# Patient Record
Sex: Male | Born: 1955 | Race: Black or African American | Hispanic: No | Marital: Single | Smoking: Current every day smoker
Health system: Southern US, Community
[De-identification: ages and names within clinical notes are randomized; demographics above are authoritative.]

## PROBLEM LIST (undated history)

## (undated) DIAGNOSIS — Z960 Presence of urogenital implants: Secondary | ICD-10-CM

## (undated) DIAGNOSIS — R399 Unspecified symptoms and signs involving the genitourinary system: Secondary | ICD-10-CM

## (undated) DIAGNOSIS — G629 Polyneuropathy, unspecified: Secondary | ICD-10-CM

## (undated) DIAGNOSIS — R269 Unspecified abnormalities of gait and mobility: Secondary | ICD-10-CM

## (undated) DIAGNOSIS — N2889 Other specified disorders of kidney and ureter: Secondary | ICD-10-CM

## (undated) DIAGNOSIS — T8859XA Other complications of anesthesia, initial encounter: Secondary | ICD-10-CM

## (undated) DIAGNOSIS — E785 Hyperlipidemia, unspecified: Secondary | ICD-10-CM

## (undated) DIAGNOSIS — I6523 Occlusion and stenosis of bilateral carotid arteries: Secondary | ICD-10-CM

## (undated) DIAGNOSIS — N201 Calculus of ureter: Secondary | ICD-10-CM

## (undated) DIAGNOSIS — Z8546 Personal history of malignant neoplasm of prostate: Secondary | ICD-10-CM

## (undated) DIAGNOSIS — I739 Peripheral vascular disease, unspecified: Secondary | ICD-10-CM

## (undated) DIAGNOSIS — Z973 Presence of spectacles and contact lenses: Secondary | ICD-10-CM

## (undated) DIAGNOSIS — K219 Gastro-esophageal reflux disease without esophagitis: Secondary | ICD-10-CM

## (undated) DIAGNOSIS — I10 Essential (primary) hypertension: Secondary | ICD-10-CM

## (undated) DIAGNOSIS — K449 Diaphragmatic hernia without obstruction or gangrene: Secondary | ICD-10-CM

## (undated) DIAGNOSIS — T4145XA Adverse effect of unspecified anesthetic, initial encounter: Secondary | ICD-10-CM

## (undated) HISTORY — PX: ROBOT ASSISTED LAPAROSCOPIC RADICAL PROSTATECTOMY: SHX5141

## (undated) HISTORY — DX: Occlusion and stenosis of bilateral carotid arteries: I65.23

## (undated) HISTORY — DX: Hyperlipidemia, unspecified: E78.5

---

## 1995-10-14 ENCOUNTER — Encounter: Payer: Self-pay | Admitting: Cardiology

## 2007-04-01 DIAGNOSIS — K921 Melena: Secondary | ICD-10-CM | POA: Insufficient documentation

## 2007-04-02 DIAGNOSIS — D509 Iron deficiency anemia, unspecified: Secondary | ICD-10-CM | POA: Insufficient documentation

## 2007-04-08 ENCOUNTER — Ambulatory Visit: Payer: Self-pay | Admitting: Gastroenterology

## 2007-04-25 ENCOUNTER — Ambulatory Visit: Payer: Self-pay | Admitting: Gastroenterology

## 2007-04-25 HISTORY — PX: COLONOSCOPY: SHX174

## 2007-08-07 DIAGNOSIS — R195 Other fecal abnormalities: Secondary | ICD-10-CM | POA: Insufficient documentation

## 2008-04-11 LAB — HM MAMMOGRAPHY

## 2009-07-15 ENCOUNTER — Encounter: Payer: Self-pay | Admitting: Cardiology

## 2009-07-15 ENCOUNTER — Inpatient Hospital Stay (HOSPITAL_COMMUNITY): Admission: EM | Admit: 2009-07-15 | Discharge: 2009-07-16 | Payer: Self-pay | Admitting: Emergency Medicine

## 2009-07-15 ENCOUNTER — Encounter (INDEPENDENT_AMBULATORY_CARE_PROVIDER_SITE_OTHER): Payer: Self-pay | Admitting: *Deleted

## 2009-07-21 ENCOUNTER — Encounter (INDEPENDENT_AMBULATORY_CARE_PROVIDER_SITE_OTHER): Payer: Self-pay | Admitting: Internal Medicine

## 2009-07-21 ENCOUNTER — Ambulatory Visit (HOSPITAL_COMMUNITY): Admission: RE | Admit: 2009-07-21 | Discharge: 2009-07-21 | Payer: Self-pay | Admitting: Internal Medicine

## 2009-10-07 ENCOUNTER — Ambulatory Visit: Payer: Self-pay | Admitting: Vascular Surgery

## 2010-07-11 NOTE — Consult Note (Signed)
Summary: [facility] CONSULT NOTE  [facility] CONSULT NOTE   Imported By: Zachary George 07/18/2009 14:18:30  _____________________________________________________________________  External Attachment:    Type:   Image     Comment:   External Document

## 2010-07-13 ENCOUNTER — Ambulatory Visit: Payer: Managed Care, Other (non HMO) | Attending: Urology | Admitting: Physical Therapy

## 2010-07-13 ENCOUNTER — Encounter: Admit: 2010-07-13 | Payer: Self-pay | Admitting: Urology

## 2010-07-13 DIAGNOSIS — M2420 Disorder of ligament, unspecified site: Secondary | ICD-10-CM | POA: Insufficient documentation

## 2010-07-13 DIAGNOSIS — M629 Disorder of muscle, unspecified: Secondary | ICD-10-CM | POA: Insufficient documentation

## 2010-07-13 DIAGNOSIS — IMO0001 Reserved for inherently not codable concepts without codable children: Secondary | ICD-10-CM | POA: Insufficient documentation

## 2010-07-28 ENCOUNTER — Other Ambulatory Visit: Payer: Self-pay | Admitting: Urology

## 2010-07-28 ENCOUNTER — Ambulatory Visit (HOSPITAL_COMMUNITY)
Admission: RE | Admit: 2010-07-28 | Discharge: 2010-07-28 | Disposition: A | Discharge status: Home / Self Care | Payer: Managed Care, Other (non HMO) | Source: Ambulatory Visit | Attending: Urology | Admitting: Urology

## 2010-07-28 ENCOUNTER — Encounter (HOSPITAL_COMMUNITY): Payer: Managed Care, Other (non HMO)

## 2010-07-28 DIAGNOSIS — Z01818 Encounter for other preprocedural examination: Secondary | ICD-10-CM

## 2010-07-28 DIAGNOSIS — N429 Disorder of prostate, unspecified: Secondary | ICD-10-CM | POA: Insufficient documentation

## 2010-07-28 LAB — BASIC METABOLIC PANEL
BUN: 10 mg/dL (ref 6–23)
CO2: 24 mEq/L (ref 19–32)
GFR calc non Af Amer: >60 mL/min (ref >60)
Glucose, Bld: 90 mg/dL (ref 70–99)
Potassium: 4.1 mEq/L (ref 3.5–5.1)

## 2010-07-28 LAB — CBC
HCT: 39.6 % (ref 39.0–52.0)
Hemoglobin: 13.7 g/dL (ref 13.0–17.0)
MCHC: 34.6 g/dL (ref 30.0–36.0)
MCV: 78.3 fL (ref 78.0–100.0)
Platelets: 215 10*3/uL (ref 150–400)
RDW: 13.2 % (ref 11.5–15.5)
WBC: 6.7 10*3/uL (ref 4.0–10.5)

## 2010-07-28 LAB — SURGICAL PCR SCREEN: MRSA, PCR: NEGATIVE

## 2010-08-07 ENCOUNTER — Inpatient Hospital Stay (HOSPITAL_COMMUNITY)
Admission: RE | Admit: 2010-08-07 | Discharge: 2010-08-09 | DRG: 708 | Disposition: A | Discharge status: Home / Self Care | Payer: Managed Care, Other (non HMO) | Source: Ambulatory Visit | Attending: Urology | Admitting: Urology

## 2010-08-07 ENCOUNTER — Other Ambulatory Visit: Payer: Self-pay | Admitting: Urology

## 2010-08-07 ENCOUNTER — Inpatient Hospital Stay (HOSPITAL_COMMUNITY): Admit: 2010-08-07 | Payer: Self-pay | Admitting: Urology

## 2010-08-07 DIAGNOSIS — R110 Nausea: Secondary | ICD-10-CM | POA: Diagnosis not present

## 2010-08-07 DIAGNOSIS — C61 Malignant neoplasm of prostate: Principal | ICD-10-CM | POA: Diagnosis present

## 2010-08-07 DIAGNOSIS — F17200 Nicotine dependence, unspecified, uncomplicated: Secondary | ICD-10-CM | POA: Diagnosis present

## 2010-08-07 LAB — ABO/RH: ABO/RH(D): B POS

## 2010-08-07 LAB — HEMOGLOBIN AND HEMATOCRIT, BLOOD
HCT: 35.7 % — ABNORMAL LOW (ref 39.0–52.0)
Hemoglobin: 12.4 g/dL — ABNORMAL LOW (ref 13.0–17.0)

## 2010-08-07 LAB — TYPE AND SCREEN: Antibody Screen: NEGATIVE

## 2010-08-08 LAB — HEMOGLOBIN AND HEMATOCRIT, BLOOD: Hemoglobin: 11.9 g/dL — ABNORMAL LOW (ref 13.0–17.0)

## 2010-08-12 NOTE — Op Note (Signed)
Perry [name], Perry [name]                ACCOUNT NO.:  [id number]  MEDICAL RECORD NO.:  [id number]           PATIENT TYPE:  I  LOCATION:  X001                         FACILITY:  [facility]  PHYSICIAN:  Heloise Purpura, MD      DATE OF BIRTH:  Mar 19, 1956  DATE OF PROCEDURE:  08/07/2010 DATE OF DISCHARGE:                              OPERATIVE REPORT   PREOPERATIVE DIAGNOSIS:  Clinically localized adenocarcinoma of the prostate (clinical stage T1C NX MX).  POSTOPERATIVE DIAGNOSIS:  Clinically localized adenocarcinoma of the prostate (clinical stage T1C NX MX).  PROCEDURE:  Robotic assisted laparoscopic radical prostatectomy (bilateral nerve sparing).  SURGEON:  Heloise Purpura, MD  ANESTHESIA:  General.  COMPLICATIONS:  None.  ESTIMATED BLOOD LOSS:  300 mL.  INTRAVENOUS FLUIDS:  1300 mL of lactated Ringer's.  SPECIMENS:  Prostate and seminal vesicles.  DISPOSITION OF SPECIMEN:  Pathology.  DRAINS: 1. 20-French Coude catheter. 2. #19 Blake pelvic drain.  INDICATIONS:  Mr. Warmuth is a 55 year old gentleman with clinically localized prostate cancer.  After discussion regarding management options for treatment, he elected to proceed with surgical therapy and the above procedure.  The potential risks, complications, and alternative treatment options were discussed in detail and informed consent was obtained.  DESCRIPTION OF PROCEDURE:  The patient was taken to the operating room and general anesthetic was administered.  He was given preoperative antibiotics, placed in the dorsal lithotomy position and prepped and draped in the usual sterile fashion.  Next, preoperative time-out was performed.  A Foley catheter was then inserted into the bladder and a site was selected just superior to the umbilicus for placement of the camera port.  This was placed using a standard open Hasson technique which allowed entry into the peritoneal cavity under direct vision and without difficulty.  A  12-mm port was then placed and pneumoperitoneum established.  With a 0-degree lens, the abdomen was inspected and there was no evidence of any intra-abdominal injuries or other abnormalities. The remaining ports were then placed.  The 8-mm robotic ports were placed in the right lower quadrant, left lower quadrant and far left lower quadrant.  A 5-mm port was placed in the right upper quadrant and a 12-mm port was placed in the far right lateral abdominal wall for laparoscopic assistance.  All ports were placed under direct vision and without difficulty.  The surgical cart was then docked.  With the aid of the cautery scissors, the bladder was reflected posteriorly allowing entry into space of Retzius and identification of the endopelvic fascia and prostate.  The endopelvic fascia was then incised from the apex back to the base of prostate bilaterally and the underlying levator muscle fibers were swept laterally off the prostate thereby isolating the dorsal venous complex.  The dorsal vein was then stapled and divided with a 45-mm Flex Echelon stapler.  The bladder neck was identified with the aid of Foley catheter manipulation and divided anteriorly.  This exposed the catheter and the catheter balloon was deflated.  The catheter was then brought into the operative field and used to retract the prostate anteriorly.  The posterior bladder neck  was then examined. There did appear to be a small median lobe which was enucleated and excised with the prostate.  Dissection proceeded between the prostate and bladder posteriorly until the vasa deferentia and seminal vesicles were identified.  The vasa deferentia were isolated, divided and lifted anteriorly.  The seminal vesicles were then dissected down to their tips with care to control the seminal vesicle arterial blood supply.  These structures were then also lifted anteriorly.  The space between Denonvilliers fascia was then dissected with a  combination of sharp and blunt dissection thereby isolating the vascular pedicles of the prostate.  The lateral prostatic fascia was then incised bilaterally allowing the neurovascular bundles to be released.  The vascular pedicles were then ligated with Hem-o-lok clips above the level of neurovascular bundles and divided with sharp cold scissor dissection allowing neurovascular preservation bilaterally.  The neurovascular bundles were then swept off the apex of the prostate and urethra and the urethra was sharply transected allowing the prostate specimen to be disarticulated.  The pelvis was then copiously irrigated.  There was no evidence for rectal injury.  There was noted be some significant venous bleeding from the distal right neurovascular bundle.  This was controlled with a figure-of-eight 3-0 Monocryl suture.  Attention then turned to the urethral anastomosis.  A 2-0 Vicryl slip-knot was placed between Denonvilliers fascia, the posterior bladder neck, and the posterior urethra to reapproximate these structures.  A double-armed 3-0 Monocryl suture was then used to perform a 360-degree running tension- free anastomosis between the bladder neck and urethra.  A new 20-French Coude catheter was then inserted into the bladder and irrigated.  There were no blood clots within the bladder and the anastomosis appeared to be watertight.  A #19 Blake drain was then brought through the left robotic port and positioned appropriately within the pelvis.  It was secured to the skin with a nylon suture.  The surgical cart was then undocked.  The right lateral 12-mm port site was closed with a 0 Vicryl suture placed laparoscopically.  All remaining ports were then removed under direct vision and the prostate specimen was removed intact within the Endopouch retrieval bag via the periumbilical port site.  This fascial opening was then closed with 2 running 0 Vicryl sutures.  All port sites were  injected with 0.25% Marcaine and reapproximated at the skin level with staples.  Sterile dressings were applied.  The patient appeared to tolerate the procedure well without complications.  He was able to be extubated and transferred to the recovery unit in satisfactory condition.     Heloise Purpura, MD     LB/MEDQ  D:  08/07/2010  T:  08/07/2010  Job:  [id number]  Electronically Signed by Heloise Purpura MD on 08/12/2010 08:04:06 AM

## 2010-08-31 LAB — BASIC METABOLIC PANEL
BUN: 10 mg/dL (ref 6–23)
Chloride: 111 mEq/L (ref 96–112)
Creatinine, Ser: 0.96 mg/dL (ref 0.4–1.5)
GFR calc Af Amer: >60 mL/min (ref >60)
Glucose, Bld: 115 mg/dL — ABNORMAL HIGH (ref 70–99)
Potassium: 3.8 mEq/L (ref 3.5–5.1)

## 2010-08-31 LAB — CARDIAC PANEL(CRET KIN+CKTOT+MB+TROPI)
CK, MB: 5.3 ng/mL — ABNORMAL HIGH (ref 0.3–4.0)
Relative Index: 1.4 (ref 0.0–2.5)
Total CK: 380 U/L — ABNORMAL HIGH (ref 7–232)
Troponin I: 0.02 ng/mL (ref 0.00–0.06)

## 2010-08-31 LAB — PROTIME-INR
INR: 1.08 (ref 0.00–1.49)
Prothrombin Time: 13.9 seconds (ref 11.6–15.2)

## 2010-08-31 LAB — POCT CARDIAC MARKERS

## 2010-08-31 LAB — URINALYSIS, ROUTINE W REFLEX MICROSCOPIC
Bilirubin Urine: NEGATIVE
pH: 7 (ref 5.0–8.0)

## 2010-08-31 LAB — DIFFERENTIAL
Basophils Absolute: 0.1 10*3/uL (ref 0.0–0.1)
Basophils Relative: 1 % (ref 0–1)
Eosinophils Absolute: 0.2 10*3/uL (ref 0.0–0.7)
Eosinophils Relative: 3 % (ref 0–5)
Lymphocytes Relative: 27 % (ref 12–46)
Lymphs Abs: 1.8 10*3/uL (ref 0.7–4.0)
Monocytes Absolute: 0.5 10*3/uL (ref 0.1–1.0)
Monocytes Relative: 7 % (ref 3–12)

## 2010-08-31 LAB — APTT: aPTT: 28 seconds (ref 24–37)

## 2010-08-31 LAB — CBC
Platelets: 211 10*3/uL (ref 150–400)
RDW: 14.1 % (ref 11.5–15.5)
WBC: 6.7 10*3/uL (ref 4.0–10.5)

## 2010-08-31 LAB — RAPID URINE DRUG SCREEN, HOSP PERFORMED
Amphetamines: NOT DETECTED
Benzodiazepines: NOT DETECTED
Cocaine: NOT DETECTED
Opiates: NOT DETECTED

## 2010-08-31 LAB — CK TOTAL AND CKMB (NOT AT ARMC): CK, MB: 5.8 ng/mL — ABNORMAL HIGH (ref 0.3–4.0)

## 2010-08-31 LAB — MAGNESIUM: Magnesium: 2 mg/dL (ref 1.5–2.5)

## 2010-09-12 ENCOUNTER — Ambulatory Visit: Payer: Managed Care, Other (non HMO) | Attending: Urology | Admitting: Physical Therapy

## 2010-09-12 DIAGNOSIS — IMO0001 Reserved for inherently not codable concepts without codable children: Secondary | ICD-10-CM | POA: Insufficient documentation

## 2010-09-12 DIAGNOSIS — M2420 Disorder of ligament, unspecified site: Secondary | ICD-10-CM | POA: Insufficient documentation

## 2010-09-12 DIAGNOSIS — M629 Disorder of muscle, unspecified: Secondary | ICD-10-CM | POA: Insufficient documentation

## 2010-09-26 ENCOUNTER — Ambulatory Visit: Payer: Managed Care, Other (non HMO) | Admitting: Physical Therapy

## 2010-10-10 ENCOUNTER — Ambulatory Visit: Payer: Managed Care, Other (non HMO) | Attending: Urology | Admitting: Physical Therapy

## 2010-10-10 DIAGNOSIS — M2420 Disorder of ligament, unspecified site: Secondary | ICD-10-CM | POA: Insufficient documentation

## 2010-10-10 DIAGNOSIS — IMO0001 Reserved for inherently not codable concepts without codable children: Secondary | ICD-10-CM | POA: Insufficient documentation

## 2010-10-10 DIAGNOSIS — M629 Disorder of muscle, unspecified: Secondary | ICD-10-CM | POA: Insufficient documentation

## 2010-10-24 NOTE — Assessment & Plan Note (Signed)
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 Rudi Heap for evaluation of  rectal bleeding and positive Hemoccult cards.   HISTORY OF PRESENT ILLNESS:  Mr. Meditz denies any general medical or GI  problems.  He had some asymptomatic rectal bleeding approximately a week  ago, saw Dr. Christell Constant, and Guaiac positive stools.  Hemoglobin was normal  at 14.1 but he had a low MCV of 79 suggestive of iron deficiency.  The  patient has regular bowel movements and denies any upper GI or  hepatobiliary complaints.  Has never had previous GI evaluations.  He  denies any chronic medical illnesses.   FAMILY HISTORY:  Noncontributory.   SOCIAL HISTORY:  He is separated and lives alone.  He has a 12th grade  education.  He smokes a pack of cigarettes per day and uses ethanol  socially.   REVIEW OF SYSTEMS:  Noncontributory.   MEDICATIONS:  None.   ALLERGIES:  None.   PHYSICAL EXAMINATION:  GENERAL:  He is a healthy appearing middle-aged  black male in no distress appearing his stated age.  He is 5 feet, 9 and  weighs 217 pounds.  VITAL SIGNS:  Blood pressure is 134/80, pulse 76 and regular. I could  not appreciate stigmata of chronic liver disease or thyromegaly.  CHEST:  Entirely clear.  HEART:  Regular rhythm without murmurs, gallops or rubs.  ABDOMEN:  There was no hepatosplenomegaly, abdominal masses or  tenderness. Bowel sounds were normal.  EXTREMITIES:  Unremarkable.  NEURO:  Mental status was clear.  RECTAL EXAM:  Showed no fissures or fistulae and rectal exam showed no  masses or tenderness or prostate enlargement. Stool was normal in color  and Guaiac negative to my exam.   ASSESSMENT:  Mr. Busler most likely has colonic polyposis accounting for  his rectal bleeding and mild iron-deficiency.  He otherwise is  completely asymptomatic.  It is unclear to me, while talking with the  patient today, whether or not he will follow through with a colonoscopy  exam despite my attempt to explain the risk and benefits of this  procedure and the chances of him developing colon carcinoma.   RECOMMENDATIONS:  We have asked the patient to schedule colonoscopy at  his convenience and we will be glad to proceed according to his wishes.     Vania Rea. Jarold Motto, MD, Caleen Essex, FAGA  Electronically Signed    DRP/MedQ  DD: 04/08/2007  DT: 04/08/2007  Job #: [id number]   cc:   Ernestina Penna, M.D.

## 2010-10-24 NOTE — Procedures (Signed)
CAROTID DUPLEX EXAM   INDICATION:  Syncope.   HISTORY:  Diabetes:  No.  Cardiac:  No.  Hypertension:  No.  Smoking:  Yes.  Previous Surgery:  No.  CV History:  No.  Amaurosis Fugax No, Paresthesias No, Hemiparesis No                                       RIGHT             LEFT  Brachial systolic pressure:         148               152  Brachial Doppler waveforms:         WNL               WNL  Vertebral direction of flow:        Antegrade         Antegrade  DUPLEX VELOCITIES (cm/sec)  CCA peak systolic                   142               136  ECA peak systolic                   100               123  ICA peak systolic                   94                109  ICA end diastolic                   37                38  PLAQUE MORPHOLOGY:                  Heterogeneous     Heterogeneous  PLAQUE AMOUNT:                      Mild              Mild  PLAQUE LOCATION:                    ICA               ICA   IMPRESSION:  1. Bilateral internal carotid artery suggests 20%-39% stenosis.  2. Antegrade flow in bilateral vertebrals.         ___________________________________________  Larina Earthly, M.D.   CB/MEDQ  D:  10/07/2009  T:  10/07/2009  Job:  [id number]

## 2010-10-30 ENCOUNTER — Encounter: Payer: Self-pay | Admitting: Nurse Practitioner

## 2012-02-15 DIAGNOSIS — R261 Paralytic gait: Secondary | ICD-10-CM | POA: Insufficient documentation

## 2013-01-06 ENCOUNTER — Ambulatory Visit: Payer: Self-pay | Admitting: Family Medicine

## 2015-04-23 DIAGNOSIS — R109 Unspecified abdominal pain: Secondary | ICD-10-CM | POA: Diagnosis not present

## 2015-04-23 DIAGNOSIS — F17200 Nicotine dependence, unspecified, uncomplicated: Secondary | ICD-10-CM | POA: Diagnosis not present

## 2015-04-23 DIAGNOSIS — Z79899 Other long term (current) drug therapy: Secondary | ICD-10-CM | POA: Diagnosis not present

## 2015-04-23 DIAGNOSIS — K529 Noninfective gastroenteritis and colitis, unspecified: Secondary | ICD-10-CM | POA: Diagnosis not present

## 2015-04-23 DIAGNOSIS — R1110 Vomiting, unspecified: Secondary | ICD-10-CM | POA: Diagnosis not present

## 2015-04-23 DIAGNOSIS — N200 Calculus of kidney: Secondary | ICD-10-CM | POA: Diagnosis not present

## 2015-04-23 DIAGNOSIS — Z886 Allergy status to analgesic agent status: Secondary | ICD-10-CM | POA: Diagnosis not present

## 2015-04-23 DIAGNOSIS — E785 Hyperlipidemia, unspecified: Secondary | ICD-10-CM | POA: Diagnosis not present

## 2015-04-23 DIAGNOSIS — G43A1 Cyclical vomiting, intractable: Secondary | ICD-10-CM | POA: Diagnosis not present

## 2015-04-24 DIAGNOSIS — R1110 Vomiting, unspecified: Secondary | ICD-10-CM | POA: Diagnosis not present

## 2015-04-24 DIAGNOSIS — E785 Hyperlipidemia, unspecified: Secondary | ICD-10-CM | POA: Diagnosis not present

## 2015-09-19 DIAGNOSIS — R112 Nausea with vomiting, unspecified: Secondary | ICD-10-CM | POA: Diagnosis not present

## 2015-09-19 DIAGNOSIS — F17200 Nicotine dependence, unspecified, uncomplicated: Secondary | ICD-10-CM | POA: Diagnosis not present

## 2015-09-19 DIAGNOSIS — N390 Urinary tract infection, site not specified: Secondary | ICD-10-CM | POA: Diagnosis not present

## 2015-09-19 DIAGNOSIS — Z79899 Other long term (current) drug therapy: Secondary | ICD-10-CM | POA: Diagnosis not present

## 2015-09-19 DIAGNOSIS — E785 Hyperlipidemia, unspecified: Secondary | ICD-10-CM | POA: Diagnosis not present

## 2015-09-21 ENCOUNTER — Encounter (HOSPITAL_COMMUNITY): Payer: Self-pay

## 2015-09-21 ENCOUNTER — Emergency Department (HOSPITAL_COMMUNITY): Payer: Medicare Other

## 2015-09-21 ENCOUNTER — Inpatient Hospital Stay (HOSPITAL_COMMUNITY)
Admission: EM | Admit: 2015-09-21 | Discharge: 2015-09-24 | DRG: 392 | Disposition: A | Discharge status: Home / Self Care | Payer: Medicare Other | Attending: Family Medicine | Admitting: Family Medicine

## 2015-09-21 DIAGNOSIS — R112 Nausea with vomiting, unspecified: Secondary | ICD-10-CM | POA: Diagnosis present

## 2015-09-21 DIAGNOSIS — Z9079 Acquired absence of other genital organ(s): Secondary | ICD-10-CM

## 2015-09-21 DIAGNOSIS — F17210 Nicotine dependence, cigarettes, uncomplicated: Secondary | ICD-10-CM | POA: Diagnosis present

## 2015-09-21 DIAGNOSIS — A09 Infectious gastroenteritis and colitis, unspecified: Secondary | ICD-10-CM | POA: Diagnosis not present

## 2015-09-21 DIAGNOSIS — Z8546 Personal history of malignant neoplasm of prostate: Secondary | ICD-10-CM

## 2015-09-21 DIAGNOSIS — R1110 Vomiting, unspecified: Secondary | ICD-10-CM | POA: Diagnosis not present

## 2015-09-21 DIAGNOSIS — R109 Unspecified abdominal pain: Secondary | ICD-10-CM

## 2015-09-21 DIAGNOSIS — E860 Dehydration: Secondary | ICD-10-CM | POA: Diagnosis present

## 2015-09-21 DIAGNOSIS — N2889 Other specified disorders of kidney and ureter: Secondary | ICD-10-CM | POA: Diagnosis not present

## 2015-09-21 DIAGNOSIS — C641 Malignant neoplasm of right kidney, except renal pelvis: Secondary | ICD-10-CM | POA: Diagnosis not present

## 2015-09-21 DIAGNOSIS — R1030 Lower abdominal pain, unspecified: Secondary | ICD-10-CM | POA: Diagnosis not present

## 2015-09-21 DIAGNOSIS — R1011 Right upper quadrant pain: Secondary | ICD-10-CM

## 2015-09-21 DIAGNOSIS — R1115 Cyclical vomiting syndrome unrelated to migraine: Secondary | ICD-10-CM

## 2015-09-21 DIAGNOSIS — N390 Urinary tract infection, site not specified: Secondary | ICD-10-CM | POA: Diagnosis present

## 2015-09-21 DIAGNOSIS — N289 Disorder of kidney and ureter, unspecified: Secondary | ICD-10-CM | POA: Diagnosis not present

## 2015-09-21 DIAGNOSIS — K529 Noninfective gastroenteritis and colitis, unspecified: Secondary | ICD-10-CM

## 2015-09-21 LAB — COMPREHENSIVE METABOLIC PANEL
ALBUMIN: 4.9 g/dL (ref 3.5–5.0)
ALK PHOS: 83 U/L (ref 38–126)
ALT: 21 U/L (ref 17–63)
AST: 38 U/L (ref 15–41)
Anion gap: 11 (ref 5–15)
BUN: 14 mg/dL (ref 6–20)
CALCIUM: 10.4 mg/dL — AB (ref 8.9–10.3)
CO2: 27 mmol/L (ref 22–32)
CREATININE: 1.01 mg/dL (ref 0.61–1.24)
Chloride: 103 mmol/L (ref 101–111)
GFR calc non Af Amer: >60 mL/min (ref >60)
GLUCOSE: 123 mg/dL — AB (ref 65–99)
Potassium: 3.9 mmol/L (ref 3.5–5.1)
SODIUM: 141 mmol/L (ref 135–145)
Total Bilirubin: 1 mg/dL (ref 0.3–1.2)
Total Protein: 8.4 g/dL — ABNORMAL HIGH (ref 6.5–8.1)

## 2015-09-21 LAB — CBC WITH DIFFERENTIAL/PLATELET
Basophils Absolute: 0 10*3/uL (ref 0.0–0.1)
Basophils Relative: 0 %
EOS ABS: 0 10*3/uL (ref 0.0–0.7)
Eosinophils Relative: 0 %
HEMATOCRIT: 42.6 % (ref 39.0–52.0)
HEMOGLOBIN: 14.8 g/dL (ref 13.0–17.0)
LYMPHS ABS: 1.6 10*3/uL (ref 0.7–4.0)
Lymphocytes Relative: 12 %
MCH: 26.2 pg (ref 26.0–34.0)
MCHC: 34.7 g/dL (ref 30.0–36.0)
MCV: 75.4 fL — ABNORMAL LOW (ref 78.0–100.0)
Monocytes Absolute: 1 10*3/uL (ref 0.1–1.0)
Monocytes Relative: 8 %
NEUTROS ABS: 10.2 10*3/uL — AB (ref 1.7–7.7)
NEUTROS PCT: 79 %
Platelets: 204 10*3/uL (ref 150–400)
RBC: 5.65 MIL/uL (ref 4.22–5.81)
RDW: 14 % (ref 11.5–15.5)
WBC: 12.8 10*3/uL — AB (ref 4.0–10.5)

## 2015-09-21 LAB — URINALYSIS, ROUTINE W REFLEX MICROSCOPIC
Bilirubin Urine: NEGATIVE
GLUCOSE, UA: NEGATIVE mg/dL
Ketones, ur: 15 mg/dL — AB
LEUKOCYTES UA: NEGATIVE
Nitrite: NEGATIVE
PH: 6.5 (ref 5.0–8.0)
PROTEIN: 100 mg/dL — AB
Specific Gravity, Urine: 1.015 (ref 1.005–1.030)

## 2015-09-21 LAB — URINE MICROSCOPIC-ADD ON
Bacteria, UA: NONE SEEN
WBC UA: NONE SEEN WBC/hpf (ref 0–5)

## 2015-09-21 LAB — LIPASE, BLOOD: Lipase: 66 U/L — ABNORMAL HIGH (ref 11–51)

## 2015-09-21 LAB — TROPONIN I

## 2015-09-21 MED ORDER — SODIUM CHLORIDE 0.9 % IV BOLUS (SEPSIS)
500.0000 mL | Freq: Once | INTRAVENOUS | Status: AC
  Administered 2015-09-21: 500 mL via INTRAVENOUS

## 2015-09-21 MED ORDER — ONDANSETRON HCL 4 MG/2ML IJ SOLN
4.0000 mg | Freq: Four times a day (QID) | INTRAMUSCULAR | Status: DC | PRN
  Administered 2015-09-21 – 2015-09-22 (×4): 4 mg via INTRAVENOUS
  Filled 2015-09-21 (×4): qty 2

## 2015-09-21 MED ORDER — SODIUM CHLORIDE 0.9 % IV SOLN
INTRAVENOUS | Status: DC
  Administered 2015-09-21 (×2): via INTRAVENOUS

## 2015-09-21 MED ORDER — SODIUM CHLORIDE 0.9 % IV SOLN: INTRAVENOUS | Status: AC

## 2015-09-21 MED ORDER — MORPHINE SULFATE (PF) 2 MG/ML IV SOLN
1.0000 mg | INTRAVENOUS | Status: DC | PRN
  Administered 2015-09-21 – 2015-09-22 (×2): 1 mg via INTRAVENOUS
  Filled 2015-09-21 (×2): qty 1

## 2015-09-21 MED ORDER — ACETAMINOPHEN 325 MG PO TABS: 650.0000 mg | ORAL_TABLET | Freq: Four times a day (QID) | ORAL | Status: DC | PRN

## 2015-09-21 MED ORDER — DEXTROSE 5 % IV SOLN
INTRAVENOUS | Status: AC
  Filled 2015-09-21: qty 10

## 2015-09-21 MED ORDER — ACETAMINOPHEN 650 MG RE SUPP: 650.0000 mg | Freq: Four times a day (QID) | RECTAL | Status: DC | PRN

## 2015-09-21 MED ORDER — FENTANYL CITRATE (PF) 100 MCG/2ML IJ SOLN
50.0000 ug | Freq: Once | INTRAMUSCULAR | Status: AC
  Administered 2015-09-21: 50 ug via INTRAVENOUS
  Filled 2015-09-21: qty 2

## 2015-09-21 MED ORDER — DEXTROSE 5 % IV SOLN
1.0000 g | INTRAVENOUS | Status: DC
  Administered 2015-09-21 – 2015-09-22 (×2): 1 g via INTRAVENOUS
  Filled 2015-09-21 (×5): qty 10

## 2015-09-21 MED ORDER — ONDANSETRON HCL 4 MG/2ML IJ SOLN
4.0000 mg | Freq: Once | INTRAMUSCULAR | Status: AC
  Administered 2015-09-21: 4 mg via INTRAVENOUS
  Filled 2015-09-21: qty 2

## 2015-09-21 MED ORDER — ONDANSETRON HCL 4 MG PO TABS: 4.0000 mg | ORAL_TABLET | Freq: Four times a day (QID) | ORAL | Status: DC | PRN

## 2015-09-21 MED ORDER — IOPAMIDOL (ISOVUE-300) INJECTION 61%
100.0000 mL | Freq: Once | INTRAVENOUS | Status: AC | PRN
  Administered 2015-09-21: 100 mL via INTRAVENOUS

## 2015-09-21 MED ORDER — SODIUM CHLORIDE 0.9 % IV SOLN
INTRAVENOUS | Status: DC
  Administered 2015-09-22 – 2015-09-23 (×3): via INTRAVENOUS

## 2015-09-21 NOTE — ED Notes (Signed)
Pt reports abd pain and n/v since Monday.   Denies diarrhea, lbm was Sunday.  Pt went to [facility] Monday for treatment and was discharged with UTI.  Pt taking antibiotics.

## 2015-09-21 NOTE — H&P (Addendum)
History and Physical  Perry [name] [id number] DOB: 1955/09/23 DOA: 09/21/2015  Referring physician: Fredia Sorrow, MD PCP: Haynes Hoehn, NP   Chief Complaint: Emesis   HPI:  60 year old man presented with numerous episodes of vomiting as well as 48 hours of abdominal pain. Seen at [facility] 48 hours ago and diagnosed with UTI. He had numerous episodes of vomiting in the emergency department and failed treatment with antiemetics and was referred for admission. CT of the abdomen and pelvis was concerning for a large renal mass consistent with renal cell carcinoma with possible metastasis  Patient has felt well until approximately 48 hours ago when he developed vomiting, since that time he's had numerous episodes of vomiting without specified aggravating or alleviating factors. No diarrhea. He's had some epigastric and upper abdominal pain. He was seen in the emergency department as above but was vomiting prior to starting antibiotics.  In the emergency department Afebrile, vital signs stable, no hypoxia Pertinent labs: CMP unremarkable except for calcium 10.4; lipase 66; WBC 12.8 Imaging: independently reviewed. Chest x-ray no acute disease, independently reviewed. CT abdomen and pelvis with right renal mass suspicious for renal cell carcinoma  Review of Systems:  Positive for nausea, vomiting, abdominal pain. Negative for fever, visual changes, sore throat, rash, new muscle aches, chest pain, SOB, dysuria, bleeding   Past Medical History  Diagnosis Date  . Hyperlipidemia   . Bilateral carotid artery stenosis     20-39%  . PSA elevation   . History of prostate biopsy 11/25/09  . Prostate cancer [facility])     Past Surgical History  Procedure Laterality Date  . Radical prostactectomy  08/07/10    Social History:  reports that he has been smoking Cigarettes.  He does not have any smokeless tobacco history on file. He reports that he does not drink alcohol or use illicit  drugs.   Allergies  Allergen Reactions  . Aspirin Other (See Comments)    unknown    Family History  Problem Relation Age of Onset  . Diabetes Mother   . Hypertension Mother   . Hypertension      family history      Prior to Admission medications   Medication Sig Start Date End Date Taking? Authorizing Provider  ciprofloxacin (CIPRO) 500 MG tablet Take 1 tablet by mouth 2 (two) times daily. 09/20/15  Yes Historical Provider, MD  prochlorperazine (COMPAZINE) 10 MG tablet Take 1 tablet by mouth daily as needed for nausea or vomiting.  09/20/15  Yes Historical Provider, MD   Physical Exam: Filed Vitals:   09/21/15 1108 09/21/15 1130 09/21/15 1215 09/21/15 1230  BP: 169/74 172/76  175/93  Pulse: 93 72 98   Temp:      TempSrc:      Resp:      Height:      Weight:      SpO2: 95% 96% 98%    Constitutional:  . Appears uncomfortable, ill, vomiting Eyes:  . Pupils and irises appear normal . Normal lids ENMT:  . external ears, nose appear normal . grossly normal hearing . Oropharynx: mucosa, tongue,posterior pharynx appear normal Neck:  . neck appears normal, no masses . no thyromegaly Respiratory:  . CTA bilaterally, no w/r/r.  . Respiratory effort normal. No retractions or accessory muscle use Cardiovascular:  . RRR, no m/r/g . No LE extremity edema   Abdomen:  . Abdomen appears normal; moderate epigastric and right upper quadrant tenderness . No hernias Musculoskeletal:  .  RUE, LUE, RLE,  LLE   o strength and tone appear grossly normal o No tenderness, masses Skin:  . No rashes, lesions, ulcers noted . palpation of skin: no induration or nodules Neurologic:  . Appears grossly normal Psychiatric:  . judgement and insight appear normal . Mental status o Mood, affect appropriate  Wt Readings from Last 3 Encounters:  09/21/15 99.791 kg (220 lb)    Labs on Admission:  Basic Metabolic Panel:  Recent Labs Lab 09/21/15 0859  NA 141  K 3.9  CL 103  CO2  27  GLUCOSE 123*  BUN 14  CREATININE 1.01  CALCIUM 10.4*    Liver Function Tests:  Recent Labs Lab 09/21/15 0859  AST 38  ALT 21  ALKPHOS 83  BILITOT 1.0  PROT 8.4*  ALBUMIN 4.9    Recent Labs Lab 09/21/15 0859  LIPASE 66*    CBC:  Recent Labs Lab 09/21/15 0859  WBC 12.8*  NEUTROABS 10.2*  HGB 14.8  HCT 42.6  MCV 75.4*  PLT 204    Radiological Exams on Admission: Dg Chest 2 View  09/21/2015  CLINICAL DATA:  Sudden onset severe lower abdominal pain for 2 days EXAM: CHEST  2 VIEW COMPARISON:  04/04/2012 FINDINGS: Cardiomediastinal silhouette is stable. No acute infiltrate or pleural effusion. No pulmonary edema. Mild degenerative changes mid and lower thoracic spine. IMPRESSION: No active cardiopulmonary disease. Electronically Signed   By: Lahoma Crocker M.D.   On: 09/21/2015 11:02   Ct Abdomen Pelvis W Contrast  09/21/2015  CLINICAL DATA:  Lower abdominal pain and pelvic pain for 2 days, nausea, history of prostate cancer and prostatectomy 2011 EXAM: CT ABDOMEN AND PELVIS WITH CONTRAST TECHNIQUE: Multidetector CT imaging of the abdomen and pelvis was performed using the standard protocol following bolus administration of intravenous contrast. CONTRAST:  142mL ISOVUE-300 IOPAMIDOL (ISOVUE-300) INJECTION 61% COMPARISON:  None. FINDINGS: Lower chest: Images of the lung bases are unremarkable. There is thickening of distal esophageal wall. Gastroesophageal reflux disease cannot be excluded. Clinical correlation is necessary. Hepatobiliary: No focal hepatic mass is noted. No intrahepatic biliary ductal dilatation. No calcified gallstones are noted within gallbladder. No CBD dilatation. Pancreas: No mass, inflammatory changes, or other significant abnormality. Spleen: Enhanced spleen is unremarkable. Adrenals/Urinary Tract: There is 2.3 cm nodular lesion left adrenal gland. Metastatic disease cannot be excluded. The kidneys are symmetrical in enhancement. There is a cyst in upper  pole of the left kidney measures 1.1 cm. Cyst in midpole anterior aspect of the left kidney measures 2.3 cm. There is indeterminate lesion in lower pole of the left kidney measures 1.2 cm. The lesion measures about 60 pound Hounsfield units in attenuation. Although may represent a complex cyst a solid lesion cannot be excluded. Further evaluation with MRI is recommended. There is a cyst in lower pole posterior aspect of the left kidney measures 1.5 cm. There is a exophytic solid liver lesion in midpole lateral aspect of the right kidney measures 3.2 x 3.1 cm. This is consistent with renal cell carcinoma. No evidence of renal vein invasion. Nonobstructive calculus in lower pole of the right kidney measures 5 mm. No hydronephrosis or hydroureter. Delayed renal images shows bilateral renal symmetrical excretion. Bilateral visualized proximal ureter is unremarkable. No calcified calculi are noted within urinary bladder. Axial image 80 there is cystic structure in right lower pelvis just inferior to the urinary bladder indenting the right posterior wall of the urinary bladder. This measures about 3 cm. This may represent postop seroma, pelvic inclusion cyst. Less likely  a seminal vesicle cyst or a bladder diverticulum. Measures 14 Hounsfield units in attenuation consistent with simple fluid. Attention should be given on follow-up examination. Stomach/Bowel: There is no small bowel obstruction. No gastric outlet obstruction. No thickened or dilated small bowel loops. Moderate stool and some gas noted within cecum and right colon. Some colonic gas noted within transverse colon. Some colonic stool noted within descending colon. Moderate case noted mid sigmoid colon. No distal colonic obstruction. Vascular/Lymphatic: Atherosclerotic calcifications of abdominal aorta and iliac arteries are noted. No aortic aneurysm. No retroperitoneal or mesenteric adenopathy. Reproductive: The patient is status post prostatectomy. No  inguinal adenopathy. No pelvic adenopathy. Other: No ascites or free abdominal air. Musculoskeletal: No destructive bony lesions are noted. No acute fractures are noted. Facet degenerative changes lower lumbar spine. Degenerative changes bilateral SI joints. IMPRESSION: 1. There is solid exophytic lesion in midpole of the right kidney laterally measures 3.2 x 3.1 cm. This is highly suspicious for renal cell carcinoma. No evidence of renal vein invasion. 2. Multiple left renal cysts are noted. There is indeterminate high-density lesion in the lower anterior cortex of the left kidney measures at least 1.2 cm. Further correlation with enhanced MRI is recommended to exclude a solid lesion. 3. Nonobstructive calcified calculus in lower pole of the right kidney measures 5 mm. No hydronephrosis or hydroureter. No calcified ureteral calculi. 4. There is thickening of distal esophageal wall. Gastroesophageal reflux disease cannot be excluded. 5. No pericecal inflammation. Normal appendix. Moderate stool and gas noted in right colon and cecum. Some colonic gas noted within transverse colon. Moderate gas noted in mid sigmoid colon. 6. Status post prostatectomy. No calcified calculi are noted within urinary bladder. There is a cystic structure and right inferior pelvis just posterior to the urinary bladder measures 2.9 cm. This may represent a postop seroma, pelvic inclusion cyst, less likely a bladder diverticulum or a seminal vesicle cyst. Attention should be given on follow-up examination. 7. There is a 2.3 cm left adrenal nodule. Metastatic disease cannot be excluded. Further correlation with MRI is recommended. These results were called by telephone at the time of interpretation on 09/21/2015 at 11:32 am to Dr. Fredia Sorrow , who verbally acknowledged these results. Electronically Signed   By: Lahoma Crocker M.D.   On: 09/21/2015 11:33      Principal Problem:   Nausea and vomiting Active Problems:   Persistent  vomiting   Renal mass   Assessment/Plan 1. Refractory nausea and vomiting. Minimal leukocytosis. Urinalysis negative. Chest x-ray and CT abdomen and pelvis without acute features. Differential includes viral gastroenteritis, less likely pancreatitis with only minimal elevation of lipase and unremarkable imaging. 2. Right renal mass, possible left renal mass, possible left adrenal mass. with possible metastasis to adrenal gland. Will pursue MRI inpatient versus outpatient. 3. Borderline elevated calcium (hypercalcemia). Suspect related to vomiting and dehydration. 4. Elevated lipase, minimal. Does have some epigastric tenderness. May be related to vomiting which seems more likely than pancreatitis. 5. UTI present on admission. Was vomiting prior to starting Cipro 6. PMH prostate cancer   Admit to medical floor  IV fluids, antibiotics, supportive care  GI pathogen panel  Repeat lipase and basic metabolic panel in the morning after hydration  MRI of the abdomen in the near future  EKG SR, no acute changes (my read)  Code Status: full code  DVT prophylaxis: SCDs Family Communication: daughter at bedside Disposition Plan/Anticipated LOS: obs, 1-2 days  Time spent: 80 minutes  Murray Hodgkins, MD  [facility] Direct contact:  --Via [place]  --[email or web address]; password [email or web address] and click  [email or web address] contact night coverage as above  09/21/2015, 1:20 PM    By signing my name below, I, Rennis Harding attest that this documentation has been prepared under the direction and in the presence of Murray Hodgkins, MD Electronically signed: Rennis Harding  09/21/2015   I personally performed the services described in this documentation. All medical record entries made by the scribe were at my direction. I have reviewed the chart and agree that the record reflects my personal performance and is accurate and complete. Murray Hodgkins, MD

## 2015-09-21 NOTE — ED Provider Notes (Signed)
CSN: [id number]     Arrival date & time 09/21/15  0848 History  By signing my name below, I, Terressa Koyanagi, attest that this documentation has been prepared under the direction and in the presence of Fredia Sorrow, MD. Electronically Signed: Terressa Koyanagi, ED Scribe. 09/21/2015. 9:29 AM.  Chief Complaint  Patient presents with  . Emesis   Patient is a 60 y.o. male presenting with vomiting. The history is provided by the patient.  Emesis Severity:  Severe Duration:  2 days Timing:  Intermittent Number of daily episodes:  20+ Quality:  Bilious material Progression:  Worsening Chronicity:  Recurrent Associated symptoms: abdominal pain and chills   Associated symptoms: no diarrhea, no headaches and no sore throat   Abdominal pain:    Location:  LLQ and RLQ   Duration:  2 days   Timing:  Constant   Progression:  Waxing and waning   Chronicity:  Recurrent  PCP: Haynes Hoehn, NP HPI Comments: Perry [name] is a 60 y.o. male, with PMHx noted below, who presents to the Emergency Department complaining of sudden onset, severe, 7/10, non-radiating, waxing and waning lower abd pain onset 2 days ago, 09/19/15 at 4:30AM. Pt reports he experienced similar Sx approximately 3 months ago. Associated Sx include intermittent n/v, with 20+ episodes of vomiting yesterday. Pt denies haematemesis. Pt further denies diarrhea and reports his last bowel movement was 3 days ago. Pt reports he was seen for the same at [facility] two days ago where he was Dx with an UTI and discharged home with Rx for antibiotics-- pt reports compliance with the antibiotics. Pt denies having a CT scan at [facility]. Pt endorses chills and back pain. Pt denies fever, congestion, rhinorrhea, sore throat, cough, SOB, visual disturbances, dysuria, hematuria, swelling of legs, headache, lightheadedness, or any new rashes. Pt further denies Hx of bleeding easily/blood thinner use. Past Medical History  Diagnosis Date  . Hyperlipidemia   .  Bilateral carotid artery stenosis     20-39%  . PSA elevation   . History of prostate biopsy 11/25/09  . Prostate cancer [facility])    Past Surgical History  Procedure Laterality Date  . Radical prostactectomy  08/07/10   Family History  Problem Relation Age of Onset  . Diabetes Mother   . Hypertension Mother   . Hypertension      family history    Social History  Substance Use Topics  . Smoking status: Current Every Day Smoker    Types: Cigarettes  . Smokeless tobacco: None  . Alcohol Use: No    Review of Systems  Constitutional: Positive for chills. Negative for fever.  HENT: Negative for congestion, rhinorrhea and sore throat.   Eyes: Negative for visual disturbance.  Respiratory: Negative for shortness of breath.   Cardiovascular: Negative for chest pain.  Gastrointestinal: Positive for nausea, vomiting and abdominal pain. Negative for diarrhea.  Genitourinary: Negative for dysuria and hematuria.  Musculoskeletal: Positive for back pain.  Skin: Negative for rash.  Neurological: Negative for headaches.  Hematological: Does not bruise/bleed easily.  Psychiatric/Behavioral: Negative for confusion.   Allergies  Aspirin  Home Medications   Prior to Admission medications   Medication Sig Start Date End Date Taking? Authorizing Provider  ciprofloxacin (CIPRO) 500 MG tablet Take 1 tablet by mouth 2 (two) times daily. 09/20/15  Yes Historical Provider, MD  prochlorperazine (COMPAZINE) 10 MG tablet Take 1 tablet by mouth daily as needed for nausea or vomiting.  09/20/15  Yes Historical Provider, MD   Triage  Vitals: BP 163/112 mmHg  Pulse 82  Temp(Src) 97.8 F (36.6 C) (Oral)  Resp 20  Ht 5\' 9"  (1.753 m)  Wt 220 lb (99.791 kg)  BMI 32.47 kg/m2  SpO2 99% Physical Exam  Constitutional: He is oriented to person, place, and time. He appears well-developed and well-nourished.  HENT:  Head: Normocephalic and atraumatic.  Eyes: EOM are normal.  Neck: Normal range of motion.   Cardiovascular: Normal rate, regular rhythm, normal heart sounds and intact distal pulses.   Pulmonary/Chest: Effort normal and breath sounds normal. No respiratory distress.  Abdominal: Soft. He exhibits no distension. There is no tenderness.  Musculoskeletal: Normal range of motion.  Neurological: He is alert and oriented to person, place, and time.  Skin: Skin is warm and dry.  Psychiatric: He has a normal mood and affect. Judgment normal.  Nursing note and vitals reviewed.   ED Course  Procedures (including critical care time) DIAGNOSTIC STUDIES: Oxygen Saturation is 99% on ra, nl by my interpretation.    COORDINATION OF CARE: 9:27 AM: Discussed treatment plan with pt at bedside; patient verbalizes understanding and agrees with treatment plan.  Labs Review Labs Reviewed  CBC WITH DIFFERENTIAL/PLATELET - Abnormal; Notable for the following:    WBC 12.8 (*)    MCV 75.4 (*)    Neutro Abs 10.2 (*)    All other components within normal limits  COMPREHENSIVE METABOLIC PANEL - Abnormal; Notable for the following:    Glucose, Bld 123 (*)    Calcium 10.4 (*)    Total Protein 8.4 (*)    All other components within normal limits  URINALYSIS, ROUTINE W REFLEX MICROSCOPIC (NOT AT [facility]) - Abnormal; Notable for the following:    Hgb urine dipstick SMALL (*)    Ketones, ur 15 (*)    Protein, ur 100 (*)    All other components within normal limits  LIPASE, BLOOD - Abnormal; Notable for the following:    Lipase 66 (*)    All other components within normal limits  URINE MICROSCOPIC-ADD ON - Abnormal; Notable for the following:    Squamous Epithelial / LPF 0-5 (*)    All other components within normal limits   Results for orders placed or performed during the hospital encounter of 09/21/15  CBC with Differential  Result Value Ref Range   WBC 12.8 (H) 4.0 - 10.5 K/uL   RBC 5.65 4.22 - 5.81 MIL/uL   Hemoglobin 14.8 13.0 - 17.0 g/dL   HCT 42.6 39.0 - 52.0 %   MCV 75.4 (L) 78.0 - 100.0  fL   MCH 26.2 26.0 - 34.0 pg   MCHC 34.7 30.0 - 36.0 g/dL   RDW 14.0 11.5 - 15.5 %   Platelets 204 150 - 400 K/uL   Neutrophils Relative % 79 %   Neutro Abs 10.2 (H) 1.7 - 7.7 K/uL   Lymphocytes Relative 12 %   Lymphs Abs 1.6 0.7 - 4.0 K/uL   Monocytes Relative 8 %   Monocytes Absolute 1.0 0.1 - 1.0 K/uL   Eosinophils Relative 0 %   Eosinophils Absolute 0.0 0.0 - 0.7 K/uL   Basophils Relative 0 %   Basophils Absolute 0.0 0.0 - 0.1 K/uL  Comprehensive metabolic panel  Result Value Ref Range   Sodium 141 135 - 145 mmol/L   Potassium 3.9 3.5 - 5.1 mmol/L   Chloride 103 101 - 111 mmol/L   CO2 27 22 - 32 mmol/L   Glucose, Bld 123 (H) 65 - 99  mg/dL   BUN 14 6 - 20 mg/dL   Creatinine, Ser 1.01 0.61 - 1.24 mg/dL   Calcium 10.4 (H) 8.9 - 10.3 mg/dL   Total Protein 8.4 (H) 6.5 - 8.1 g/dL   Albumin 4.9 3.5 - 5.0 g/dL   AST 38 15 - 41 U/L   ALT 21 17 - 63 U/L   Alkaline Phosphatase 83 38 - 126 U/L   Total Bilirubin 1.0 0.3 - 1.2 mg/dL   GFR calc non Af Amer >60 >60 mL/min   GFR calc Af Amer >60 >60 mL/min   Anion gap 11 5 - 15  Urinalysis, Routine w reflex microscopic (not at [facility])  Result Value Ref Range   Color, Urine YELLOW YELLOW   APPearance CLEAR CLEAR   Specific Gravity, Urine 1.015 1.005 - 1.030   pH 6.5 5.0 - 8.0   Glucose, UA NEGATIVE NEGATIVE mg/dL   Hgb urine dipstick SMALL (A) NEGATIVE   Bilirubin Urine NEGATIVE NEGATIVE   Ketones, ur 15 (A) NEGATIVE mg/dL   Protein, ur 100 (A) NEGATIVE mg/dL   Nitrite NEGATIVE NEGATIVE   Leukocytes, UA NEGATIVE NEGATIVE  Lipase, blood  Result Value Ref Range   Lipase 66 (H) 11 - 51 U/L  Urine microscopic-add on  Result Value Ref Range   Squamous Epithelial / LPF 0-5 (A) NONE SEEN   WBC, UA NONE SEEN 0 - 5 WBC/hpf   RBC / HPF 0-5 0 - 5 RBC/hpf   Bacteria, UA NONE SEEN NONE SEEN     Imaging Review Dg Chest 2 View  09/21/2015  CLINICAL DATA:  Sudden onset severe lower abdominal pain for 2 days EXAM: CHEST  2 VIEW  COMPARISON:  04/04/2012 FINDINGS: Cardiomediastinal silhouette is stable. No acute infiltrate or pleural effusion. No pulmonary edema. Mild degenerative changes mid and lower thoracic spine. IMPRESSION: No active cardiopulmonary disease. Electronically Signed   By: Lahoma Crocker M.D.   On: 09/21/2015 11:02   Ct Abdomen Pelvis W Contrast  09/21/2015  CLINICAL DATA:  Lower abdominal pain and pelvic pain for 2 days, nausea, history of prostate cancer and prostatectomy 2011 EXAM: CT ABDOMEN AND PELVIS WITH CONTRAST TECHNIQUE: Multidetector CT imaging of the abdomen and pelvis was performed using the standard protocol following bolus administration of intravenous contrast. CONTRAST:  159mL ISOVUE-300 IOPAMIDOL (ISOVUE-300) INJECTION 61% COMPARISON:  None. FINDINGS: Lower chest: Images of the lung bases are unremarkable. There is thickening of distal esophageal wall. Gastroesophageal reflux disease cannot be excluded. Clinical correlation is necessary. Hepatobiliary: No focal hepatic mass is noted. No intrahepatic biliary ductal dilatation. No calcified gallstones are noted within gallbladder. No CBD dilatation. Pancreas: No mass, inflammatory changes, or other significant abnormality. Spleen: Enhanced spleen is unremarkable. Adrenals/Urinary Tract: There is 2.3 cm nodular lesion left adrenal gland. Metastatic disease cannot be excluded. The kidneys are symmetrical in enhancement. There is a cyst in upper pole of the left kidney measures 1.1 cm. Cyst in midpole anterior aspect of the left kidney measures 2.3 cm. There is indeterminate lesion in lower pole of the left kidney measures 1.2 cm. The lesion measures about 60 pound Hounsfield units in attenuation. Although may represent a complex cyst a solid lesion cannot be excluded. Further evaluation with MRI is recommended. There is a cyst in lower pole posterior aspect of the left kidney measures 1.5 cm. There is a exophytic solid liver lesion in midpole lateral aspect of  the right kidney measures 3.2 x 3.1 cm. This is consistent with renal cell carcinoma.  No evidence of renal vein invasion. Nonobstructive calculus in lower pole of the right kidney measures 5 mm. No hydronephrosis or hydroureter. Delayed renal images shows bilateral renal symmetrical excretion. Bilateral visualized proximal ureter is unremarkable. No calcified calculi are noted within urinary bladder. Axial image 80 there is cystic structure in right lower pelvis just inferior to the urinary bladder indenting the right posterior wall of the urinary bladder. This measures about 3 cm. This may represent postop seroma, pelvic inclusion cyst. Less likely a seminal vesicle cyst or a bladder diverticulum. Measures 14 Hounsfield units in attenuation consistent with simple fluid. Attention should be given on follow-up examination. Stomach/Bowel: There is no small bowel obstruction. No gastric outlet obstruction. No thickened or dilated small bowel loops. Moderate stool and some gas noted within cecum and right colon. Some colonic gas noted within transverse colon. Some colonic stool noted within descending colon. Moderate case noted mid sigmoid colon. No distal colonic obstruction. Vascular/Lymphatic: Atherosclerotic calcifications of abdominal aorta and iliac arteries are noted. No aortic aneurysm. No retroperitoneal or mesenteric adenopathy. Reproductive: The patient is status post prostatectomy. No inguinal adenopathy. No pelvic adenopathy. Other: No ascites or free abdominal air. Musculoskeletal: No destructive bony lesions are noted. No acute fractures are noted. Facet degenerative changes lower lumbar spine. Degenerative changes bilateral SI joints. IMPRESSION: 1. There is solid exophytic lesion in midpole of the right kidney laterally measures 3.2 x 3.1 cm. This is highly suspicious for renal cell carcinoma. No evidence of renal vein invasion. 2. Multiple left renal cysts are noted. There is indeterminate high-density  lesion in the lower anterior cortex of the left kidney measures at least 1.2 cm. Further correlation with enhanced MRI is recommended to exclude a solid lesion. 3. Nonobstructive calcified calculus in lower pole of the right kidney measures 5 mm. No hydronephrosis or hydroureter. No calcified ureteral calculi. 4. There is thickening of distal esophageal wall. Gastroesophageal reflux disease cannot be excluded. 5. No pericecal inflammation. Normal appendix. Moderate stool and gas noted in right colon and cecum. Some colonic gas noted within transverse colon. Moderate gas noted in mid sigmoid colon. 6. Status post prostatectomy. No calcified calculi are noted within urinary bladder. There is a cystic structure and right inferior pelvis just posterior to the urinary bladder measures 2.9 cm. This may represent a postop seroma, pelvic inclusion cyst, less likely a bladder diverticulum or a seminal vesicle cyst. Attention should be given on follow-up examination. 7. There is a 2.3 cm left adrenal nodule. Metastatic disease cannot be excluded. Further correlation with MRI is recommended. These results were called by telephone at the time of interpretation on 09/21/2015 at 11:32 am to Dr. Fredia Sorrow , who verbally acknowledged these results. Electronically Signed   By: Lahoma Crocker M.D.   On: 09/21/2015 11:33   I have personally reviewed and evaluated these images and lab results as part of my medical decision-making.   EKG Interpretation None      MDM   Final diagnoses:  Persistent vomiting  Renal mass    Patient with persistent nausea and vomiting since Monday. Also with lower abdominal pain. Patient had the pain at times in the past. Workup here shows no evidence of any bowel obstruction or acute abdominal process. However there is a large renal mass most consistent with renal cell carcinoma. And also an abnormality on the left adrenal gland which could represent metastatic disease. Patient has had his  prostate removed in the past and does have a history of  prostate cancer. Patient lives by himself. Patient's had persistent vomiting here despite Zofran. Will require admission for the persistent vomiting and hydration and also will require workup for the renal mass.  I personally performed the services described in this documentation, which was scribed in my presence. The recorded information has been reviewed and is accurate.      Fredia Sorrow, MD 09/21/15 1315

## 2015-09-22 ENCOUNTER — Observation Stay (HOSPITAL_COMMUNITY): Payer: Medicare Other

## 2015-09-22 DIAGNOSIS — Z9079 Acquired absence of other genital organ(s): Secondary | ICD-10-CM | POA: Diagnosis not present

## 2015-09-22 DIAGNOSIS — N390 Urinary tract infection, site not specified: Secondary | ICD-10-CM | POA: Diagnosis present

## 2015-09-22 DIAGNOSIS — R1110 Vomiting, unspecified: Secondary | ICD-10-CM | POA: Diagnosis not present

## 2015-09-22 DIAGNOSIS — N289 Disorder of kidney and ureter, unspecified: Secondary | ICD-10-CM | POA: Diagnosis not present

## 2015-09-22 DIAGNOSIS — R1011 Right upper quadrant pain: Secondary | ICD-10-CM | POA: Diagnosis not present

## 2015-09-22 DIAGNOSIS — A09 Infectious gastroenteritis and colitis, unspecified: Secondary | ICD-10-CM | POA: Diagnosis present

## 2015-09-22 DIAGNOSIS — R112 Nausea with vomiting, unspecified: Secondary | ICD-10-CM | POA: Diagnosis not present

## 2015-09-22 DIAGNOSIS — C641 Malignant neoplasm of right kidney, except renal pelvis: Secondary | ICD-10-CM | POA: Diagnosis present

## 2015-09-22 DIAGNOSIS — N2889 Other specified disorders of kidney and ureter: Secondary | ICD-10-CM | POA: Diagnosis not present

## 2015-09-22 DIAGNOSIS — Z8546 Personal history of malignant neoplasm of prostate: Secondary | ICD-10-CM | POA: Diagnosis not present

## 2015-09-22 DIAGNOSIS — K529 Noninfective gastroenteritis and colitis, unspecified: Secondary | ICD-10-CM | POA: Diagnosis not present

## 2015-09-22 DIAGNOSIS — F17210 Nicotine dependence, cigarettes, uncomplicated: Secondary | ICD-10-CM | POA: Diagnosis present

## 2015-09-22 DIAGNOSIS — E860 Dehydration: Secondary | ICD-10-CM | POA: Diagnosis present

## 2015-09-22 LAB — COMPREHENSIVE METABOLIC PANEL
ALBUMIN: 3.9 g/dL (ref 3.5–5.0)
ALT: 19 U/L (ref 17–63)
ANION GAP: 8 (ref 5–15)
AST: 26 U/L (ref 15–41)
Alkaline Phosphatase: 65 U/L (ref 38–126)
BUN: 14 mg/dL (ref 6–20)
CO2: 25 mmol/L (ref 22–32)
Calcium: 9.5 mg/dL (ref 8.9–10.3)
Chloride: 108 mmol/L (ref 101–111)
Creatinine, Ser: 0.88 mg/dL (ref 0.61–1.24)
GFR calc Af Amer: >60 mL/min (ref >60)
GFR calc non Af Amer: >60 mL/min (ref >60)
GLUCOSE: 106 mg/dL — AB (ref 65–99)
POTASSIUM: 3.5 mmol/L (ref 3.5–5.1)
SODIUM: 141 mmol/L (ref 135–145)
TOTAL PROTEIN: 6.7 g/dL (ref 6.5–8.1)
Total Bilirubin: 0.8 mg/dL (ref 0.3–1.2)

## 2015-09-22 LAB — CBC
HCT: 38.6 % — ABNORMAL LOW (ref 39.0–52.0)
HEMOGLOBIN: 12.7 g/dL — AB (ref 13.0–17.0)
MCH: 25 pg — ABNORMAL LOW (ref 26.0–34.0)
MCHC: 32.9 g/dL (ref 30.0–36.0)
MCV: 76 fL — ABNORMAL LOW (ref 78.0–100.0)
Platelets: 193 10*3/uL (ref 150–400)
RBC: 5.08 MIL/uL (ref 4.22–5.81)
RDW: 13.8 % (ref 11.5–15.5)
WBC: 10.9 10*3/uL — ABNORMAL HIGH (ref 4.0–10.5)

## 2015-09-22 LAB — LIPASE, BLOOD: Lipase: 37 U/L (ref 11–51)

## 2015-09-22 MED ORDER — GADOBENATE DIMEGLUMINE 529 MG/ML IV SOLN
20.0000 mL | Freq: Once | INTRAVENOUS | Status: AC | PRN
  Administered 2015-09-22: 20 mL via INTRAVENOUS

## 2015-09-22 MED ORDER — MORPHINE SULFATE (PF) 2 MG/ML IV SOLN
1.0000 mg | INTRAVENOUS | Status: DC | PRN
  Administered 2015-09-22 (×2): 1 mg via INTRAVENOUS
  Filled 2015-09-22 (×2): qty 1

## 2015-09-22 MED ORDER — POLYETHYLENE GLYCOL 3350 17 G PO PACK
17.0000 g | PACK | Freq: Two times a day (BID) | ORAL | Status: DC
  Administered 2015-09-23 (×2): 17 g via ORAL
  Filled 2015-09-22 (×3): qty 1

## 2015-09-22 NOTE — Care Management Obs Status (Signed)
[facility] NOTIFICATION   Patient Details  Name: Perry [name] MRN: [id number] Date of Birth: 1955-06-13   Medicare Observation Status Notification Given:  Yes    Alvie Heidelberg, RN 09/22/2015, 11:56 AM

## 2015-09-22 NOTE — Progress Notes (Signed)
PROGRESS NOTE  Perry [name] [id number] DOB: [birth year] DOA: 09/21/2015 PCP: Perry Hoehn, NP  Summary: 15 yom presented with complaints of several episodes of vomiting as well as abd pain for the past 48 hrs. He had previously been seen at [facility] and was diagnosed wit a UTI. He was evaluated in [facility]  ED where he experienced several episodes of vomiting without alleviation of symptoms aftr being given antiemetics. He was admitted for further management.  Assessment/Plan: 1. Refractory nausea and vomiting. Etiology unclear, urinalysis negative. Chest x-ray no acute features. CT abdomen and pelvis without acute features. Leukocytosis has improved. Epigastric and right upper quadrant pain has improved. His right upper quadrant ultrasound was unremarkable and lipase has returned to normal (was only minimally elevated). Suspect viral gastroenteritis, no evidence to suggest pancreatitis at this point. Consider constipation as well. Doubt related to renal mass. If does not improve with supportive care consider MRI brain, patient currently has no headache. 2. Right renal mass, possible left renal mass, possible left adrenal mass with possible metastasis to adrenal gland. Will order an MRI abd. 3. Elevated lipase, minimal. Resolved. Does not clinically fit the picture pancreatitis. 4. UTI present on admission. Was vomiting prior to starting Cipro. Continue empiric abx 5. PMH prostate cancer   Overall about the same continues with nausea and some vomiting.  IV fluids, antibiotics, supportive care  Follow up GI pathogen panel  Order MRI of the abdomen  Bowel regimen  Code Status: full DVT prophylaxis: SCD's Family Communication: Discussed with patient Disposition Plan: discharge once improved  Murray Hodgkins, MD  [facility] Direct contact:  --[place]ia [place]  --[email or web address]; password [email or web address] and click  [phone] contact night coverage as above 09/22/2015, 7:02 AM     Consultants:  none  Procedures:  none  Antibiotics:  Rocephin 4/12 >>  HPI/Subjective: Pt complains of epigastric pain. Continues to have vomiting throughout the night. Pt denies any diarrhea. Has not had any BM since Sunday.   Objective: Filed Vitals:   09/21/15 1430 09/21/15 1542 09/21/15 2027 09/22/15 0620  BP: 173/94 181/92 168/81 152/68  Pulse: 70 78 70 72  Temp:  99 F (37.2 C) 98.9 F (37.2 C) 98.1 F (36.7 C)  TempSrc:  Oral Oral Oral  Resp:  20 20 20   Height:  5\' 9"  (1.753 m)    Weight:  99 kg (218 lb 4.1 oz)    SpO2: 97% 98% 97% 96%    Intake/Output Summary (Last 24 hours) at 09/22/15 0702 Last data filed at 09/22/15 [id number]  Gross per 24 hour  Intake      0 ml  Output    950 ml  Net   -950 ml     Filed Weights   09/21/15 0857 09/21/15 1542  Weight: 99.791 kg (220 lb) 99 kg (218 lb 4.1 oz)    Exam:    General:  Appears calm and comfortable Cardiovascular: RRR, no m/r/g. No LE edema.   Respiratory: CTA bilaterally, no w/r/r. Normal respiratory effort.  Abdomen: mild epigastric and RUQ pain, soft, no rebound or guarding. Musculoskeletal: grossly normal tone BUE/BLE Psychiatric: grossly normal mood and affect, speech fluent and appropriate  New data reviewed:  WBC mildly elevated, but improved  Hgb 12.7 with fluids  Blood sugars stable  LFTs wnl.  BMP unremarkable.  Lipase normal  Troponins normal  VSS  Scheduled Meds: . cefTRIAXone (ROCEPHIN)  IV  1 g Intravenous Q24H   Continuous Infusions: . sodium chloride 100 mL/hr at  09/21/15 1605  . sodium chloride 150 mL/hr at 09/22/15 [id number]    Principal Problem:   Nausea and vomiting Active Problems:   Persistent vomiting   Renal mass   Time spent 25 minutes   By signing my name below, I, Delene Ruffini, attest that this documentation has been prepared under the direction and in the presence of Daleon Willinger P. Sarajane Jews, MD. Electronically Signed: Delene Ruffini, Scribe.   09/22/2015 11:10am  I personally performed the services described in this documentation. All medical record entries made by the scribe were at my direction. I have reviewed the chart and agree that the record reflects my personal performance and is accurate and complete. Murray Hodgkins, MD

## 2015-09-23 LAB — GASTROINTESTINAL PANEL BY PCR, STOOL (REPLACES STOOL CULTURE)

## 2015-09-23 LAB — BASIC METABOLIC PANEL
Anion gap: 6 (ref 5–15)
BUN: 14 mg/dL (ref 6–20)
CALCIUM: 8.9 mg/dL (ref 8.9–10.3)
CO2: 25 mmol/L (ref 22–32)
CREATININE: 0.84 mg/dL (ref 0.61–1.24)
Chloride: 109 mmol/L (ref 101–111)
Glucose, Bld: 99 mg/dL (ref 65–99)
Potassium: 3.3 mmol/L — ABNORMAL LOW (ref 3.5–5.1)
SODIUM: 140 mmol/L (ref 135–145)

## 2015-09-23 MED ORDER — CEPHALEXIN 250 MG PO CAPS
250.0000 mg | ORAL_CAPSULE | Freq: Four times a day (QID) | ORAL | Status: DC
  Administered 2015-09-23 – 2015-09-24 (×3): 250 mg via ORAL
  Filled 2015-09-23 (×3): qty 1

## 2015-09-23 MED ORDER — POTASSIUM CHLORIDE CRYS ER 20 MEQ PO TBCR
40.0000 meq | EXTENDED_RELEASE_TABLET | Freq: Once | ORAL | Status: AC
  Administered 2015-09-23: 40 meq via ORAL
  Filled 2015-09-23: qty 2

## 2015-09-23 NOTE — Care Management Important Message (Signed)
Important Message  Patient Details  Name: Perry [name] MRN: [id number] Date of Birth: [birth year]   Medicare Important Message Given:  Yes    Alvie Heidelberg, RN 09/23/2015, 11:33 AM

## 2015-09-23 NOTE — Progress Notes (Signed)
Patient IV infiltrated. Unable to give Rocephin due to lose of IV access. Patient request no new IV. Dr. Sarajane Jews notified. Ok to not have IV access.

## 2015-09-23 NOTE — Progress Notes (Signed)
PROGRESS NOTE  Perry [name] [id number] DOB: 02/25/1956 DOA: 09/21/2015 PCP: Haynes Hoehn, NP  Summary: 41 yom presented with complaints of several episodes of vomiting as well as abd pain for the past 48 hrs. He had previously been seen at [facility] and was diagnosed wit a UTI. He was evaluated in [facility]  ED where he experienced several episodes of vomiting without alleviation of symptoms aftr being given antiemetics. He was admitted for further management.  Assessment/Plan: 1. Refractory nausea and vomiting. Etiology unclear but significantly improved. Urinalysis negative but was on antibiotics for UTI prior to admission. Chest x-ray and CT abdomen and pelvis without acute features. Leukocytosis nearly resolved. Upper abdominal pain continues to improve. Right upper quadrant ultrasound was unremarkable and lipase was only minimally elevated. Continue to suspect viral gastroenteritis, no evidence of pancreatitis. 2. Right renal mass, highly suspicious for renal cell carcinoma. Fortunately no evidence of metastasis. Discussed with patient. 3. UTI present on admission. Continue empiric antibiotics. 4. PMH prostate cancer   Overall improved, anticipate discharge tomorrow if continues to improve   Advance diet   IV fluids, antibiotics, supportive care  Follow up GI pathogen panel  Code Status: full DVT prophylaxis: SCD's Family Communication: sister bedside  Disposition Home  Murray Hodgkins, MD  [facility] Direct contact:  --Via [place]  --[email or web address]; password [email or web address] and click  [phone] contact night coverage as above 09/23/2015, 6:51 AM  LOS: 1 day   Consultants:  none  Procedures:  none  Antibiotics:  Rocephin 4/12 >>  HPI/Subjective: Feels better today. Was able to drink fluids. Denies vomiting or nausea. Minimal abdominal pain.  Objective: Filed Vitals:   09/22/15 0900 09/22/15 1429 09/22/15 2253 09/23/15 0528  BP:  147/88 146/70 132/68    Pulse: 73 86 77 67  Temp:  98.5 F (36.9 C) 98.1 F (36.7 C) 98.6 F (37 C)  TempSrc:  Oral Oral Oral  Resp:  20 20 20   Height:      Weight:      SpO2: 95% 98% 97% 96%    Intake/Output Summary (Last 24 hours) at 09/23/15 0651 Last data filed at 09/23/15 0308  Gross per 24 hour  Intake   1200 ml  Output    775 ml  Net    425 ml     Filed Weights   09/21/15 0857 09/21/15 1542  Weight: 99.791 kg (220 lb) 99 kg (218 lb 4.1 oz)    Exam:    General:  Appears comfortable, calm. Cardiovascular: Regular rate and rhythm, no murmur, rub or gallop. No lower extremity edema. Respiratory: Clear to auscultation bilaterally, no wheezes, rales or rhonchi. Normal respiratory effort. Abdomen: minimal upper tenderness, non-distended, no rebound or guarding. Psychiatric: grossly normal mood and affect, speech fluent and appropriate  New data reviewed:   Potassium 3.3  BUN and creatinine unremarkable  Scheduled Meds: . cefTRIAXone (ROCEPHIN)  IV  1 g Intravenous Q24H  . polyethylene glycol  17 g Oral BID   Continuous Infusions: . sodium chloride 150 mL/hr at 09/22/15 [id number]    Principal Problem:   Nausea and vomiting Active Problems:   Persistent vomiting   Renal mass   Time spent 20 minutes   By signing my name below, I, Delene Ruffini, attest that this documentation has been prepared under the direction and in the presence of Daniel P. Sarajane Jews, MD. Electronically Signed: Delene Ruffini, Scribe.  09/23/2015 10:18am  I personally performed the services described in this documentation. All medical record entries  made by the scribe were at my direction. I have reviewed the chart and agree that the record reflects my personal performance and is accurate and complete. Murray Hodgkins, MD

## 2015-09-24 DIAGNOSIS — K529 Noninfective gastroenteritis and colitis, unspecified: Secondary | ICD-10-CM

## 2015-09-24 NOTE — Progress Notes (Signed)
  PROGRESS NOTE  Perry [name] [id number] DOB: 02-25-1956 DOA: 09/21/2015 PCP: Gerome Sam, MD  Summary: 9 yom presented with complaints of several episodes of vomiting as well as abd pain for the past 48 hrs. He had previously been seen at [facility] and was diagnosed wit a UTI. He was evaluated in [facility]  ED where he experienced several episodes of vomiting without alleviation of symptoms after being given antiemetics. He was admitted for further management.  Assessment/Plan: 1. Acute gastroenteritis, suspect presumed infectious with refractory nausea and vomiting. Resolved. C. difficile testing as well as GI pathogen panel unremarkable. Urinalysis negative but patient was on antibiotics prior to admission. Chest x-ray and CT abdomen and pelvis without acute features. Right upper quadrant ultrasound was unremarkable and lipase elevation was insignificant. 2. Right renal mass, highly suspicious for renal cell carcinoma. Fortunately no evidence of metastasis. Patient wants to have follow up with his [place] physician to make further arrangements.  3. UTI present on admission. Has completed empiric antibiotics. 4. PMH prostate cancer   Acute symptoms resolved. Stable for discharge today.  He is going to follow-up with his PCP as above for further evaluation of renal mass. He knows it is important to have further evaluation.  Code Status: full DVT prophylaxis: SCDs Family Communication: Discussed with patient Disposition: Home today  Murray Hodgkins, MD  [facility] Direct contact:  --Via amion app OR  --[email or web address]; password [email or web address] and click  [email or web address] contact night coverage as above 09/24/2015, 4:06 PM  LOS: 2 days   Consultants:  none  Procedures:  none  Antibiotics:  Rocephin 4/12 >>  HPI/Subjective: Feeling well. Is eating well. Headaches for breakfast. Denies any n/v, or pain.  Objective: Filed Vitals:   09/23/15 1453 09/23/15 1551 09/23/15 2140  09/24/15 0559  BP: 127/67  138/69 133/52  Pulse: 71  63 67  Temp: 98.9 F (37.2 C)  99 F (37.2 C) 98.7 F (37.1 C)  TempSrc: Oral  Oral Oral  Resp: 18  18 18   Height:      Weight:      SpO2: 97% 100% 98% 96%    Intake/Output Summary (Last 24 hours) at 09/24/15 1606 Last data filed at 09/24/15 0943  Gross per 24 hour  Intake    480 ml  Output    500 ml  Net    -20 ml     Filed Weights   09/21/15 0857 09/21/15 1542  Weight: 99.791 kg (220 lb) 99 kg (218 lb 4.1 oz)    Exam:    General:  Appears calm and comfortable Cardiovascular: RRR, no m/r/g.   Respiratory: CTA bilaterally, no w/r/r. Normal respiratory effort. Abdomen: soft, ntnd Psychiatric: grossly normal mood and affect, speech fluent and appropriate  New data reviewed:  GI pathogen panel negative  Scheduled Meds:  Continuous Infusions:   Principal Problem:   Gastroenteritis, acute Active Problems:   Persistent vomiting   Nausea and vomiting   Renal mass   By signing my name below, I, Delene Ruffini, attest that this documentation has been prepared under the direction and in the presence of Shateria Paternostro P. Sarajane Jews, MD. Electronically Signed: Delene Ruffini, Scribe.  09/24/2015 10:30am  I personally performed the services described in this documentation. All medical record entries made by the scribe were at my direction. I have reviewed the chart and agree that the record reflects my personal performance and is accurate and complete. Murray Hodgkins, MD

## 2015-09-24 NOTE — Discharge Summary (Signed)
Physician Discharge Summary  Perry [name] [id number] DOB: 11-03-1955 DOA: 09/21/2015  PCP: Perry Sam, MD  Admit date: 09/21/2015 Discharge date: 09/24/2015  Recommendations for Outpatient Follow-up:  1. Follow-up solid right renal mass suspicious for renal cell carcinoma. Suggest outpatient urology and oncology evaluation.   Follow-up Information    Follow up with Perry Sam, MD. Schedule an appointment as soon as possible for a visit in 1 week.   Specialty:  Internal Medicine   Contact information:   [address] [place] [address] [phone]      Discharge Diagnoses:  1. Acute gastroenteritis, presumed infectious 2. Refractory nausea and vomiting 3. Right renal suspicious for renal cell carcinoma 4. UTI present on admission. 5. PMHx prostate CA.  Discharge Condition: improved Disposition: discharge home  Diet recommendation: regular  Filed Weights   09/21/15 0857 09/21/15 1542  Weight: 99.791 kg (220 lb) 99 kg (218 lb 4.1 oz)    History of present illness:  70 yom presented with complaints of several episodes of vomiting as well as abd pain for the past 48 hrs. He had previously been seen at [facility] and was diagnosed wit a UTI. He was evaluated in [facility] ED where he experienced several episodes of vomiting without alleviation of symptoms after being given antiemetics. He was admitted for further management.  Hospital Course:  Patient was admitted and treated with antiemetics, analgesics and supportive care. Extensive workup including abdominal ultrasound, chest x-ray and CT abdomen and pelvis noted below. CT did show a right renal mass and follow-up MRI findings highly suspicious for renal cell carcinoma. This was discussed with the patient and he will follow-up as above. Symptoms resolved with supportive care and most likely represent acute viral gastroenteritis. Individual issues as below.    1. Acute gastroenteritis,  suspect presumed infectious with refractory nausea and vomiting. Resolved. C. difficile testing as well as GI pathogen panel unremarkable. Urinalysis negative but patient was on antibiotics prior to admission. Chest x-ray and CT abdomen and pelvis without acute features. Right upper quadrant ultrasound was unremarkable and lipase elevation was insignificant. 2. Right renal mass, highly suspicious for renal cell carcinoma. Fortunately no evidence of metastasis. Patient wants to have follow up with his [place] physician to make further arrangements.  3. UTI present on admission. Has completed empiric antibiotics. 4. PMH prostate cancer  Consultants:  none  Procedures:  None  Discharge Instructions  Discharge Instructions    Activity as tolerated - No restrictions    Complete by:  As directed      Diet general    Complete by:  As directed      Discharge instructions    Complete by:  As directed   Call your physician or seek immediate medical attention for vomiting, pain, fever or worsening of condition.          Discharge Medication List as of 09/24/2015 10:38 AM    CONTINUE these medications which have NOT CHANGED   Details  Cholecalciferol 1000 units tablet Take 1,000 Units by mouth daily., Until Discontinued, Historical Med    Omega-3 Fatty Acids (FISH OIL) 1000 MG CAPS Take 1 capsule by mouth 2 (two) times daily., Until Discontinued, Historical Med    oxybutynin (DITROPAN-XL) 10 MG 24 hr tablet Take 20 mg by mouth daily., Until Discontinued, Historical Med    pravastatin (PRAVACHOL) 10 MG tablet Take 5 mg by mouth at bedtime., Until Discontinued, Historical Med    prochlorperazine (COMPAZINE) 10 MG tablet Take 1  tablet by mouth daily as needed for nausea or vomiting. , Starting 09/20/2015, Until Discontinued, Historical Med    sildenafil (VIAGRA) 100 MG tablet Take 100 mg by mouth daily as needed for erectile dysfunction., Until Discontinued, Historical Med      STOP taking  these medications     ciprofloxacin (CIPRO) 500 MG tablet        Allergies  Allergen Reactions  . Aspirin Other (See Comments)    unknown    The results of significant diagnostics from this hospitalization (including imaging, microbiology, ancillary and laboratory) are listed below for reference.    Significant Diagnostic Studies: Dg Chest 2 View  09/21/2015  CLINICAL DATA:  Sudden onset severe lower abdominal pain for 2 days EXAM: CHEST  2 VIEW COMPARISON:  04/04/2012 FINDINGS: Cardiomediastinal silhouette is stable. No acute infiltrate or pleural effusion. No pulmonary edema. Mild degenerative changes mid and lower thoracic spine. IMPRESSION: No active cardiopulmonary disease. Electronically Signed   By: Lahoma Crocker M.D.   On: 09/21/2015 11:02   [name] Contrast  09/23/2015  CLINICAL DATA:  Right renal mass.  Abnormal CT. EXAM: MRI ABDOMEN WITHOUT AND WITH CONTRAST TECHNIQUE: Multiplanar multisequence MR imaging of the abdomen was performed both before and after the administration of intravenous contrast. CONTRAST:  88mL MULTIHANCE GADOBENATE DIMEGLUMINE 529 MG/ML IV SOLN COMPARISON:  09/22/2015 FINDINGS: Lower chest:  No pleural fluid. Hepatobiliary: Mild hepatic steatosis. No suspicious enhancing liver abnormality.The gallbladder appears normal. No gallstones. No choledocholithiasis identified. No biliary dilatation. Pancreas: Normal. Spleen: Normal in size.  No focal abnormality. Adrenals/Urinary Tract: There is a nodule within the left adrenal gland which measures 2.6 cm. This does not have signal characteristics of a benign adenoma. Normal appearance of the right adrenal gland. Solid enhancing mass arising from the mid pole of the right kidney measures 2.8 cm, image 35 of series 14. Several Bosniak category scratched arising from the inferior pole of the left kidney is a T1 hyperintense nonenhancing lesion compatible with a hemorrhagic cyst. Bosniak category 1 lesions within the left  kidney are noted. Stomach/Bowel: Small hiatal hernia. No pathologic dilatation of the upper abdominal bowel loops. Vascular/Lymphatic: Normal appearance of the abdominal aorta. No adenopathy. The IVC and renal veins are patent. Other: None Musculoskeletal: No abnormal signal from within the bone marrow. IMPRESSION: 1. Solid enhancing lesion within the mid right kidney is identified and is compatible with a renal cell carcinoma. No evidence for renal vein involvement, adenopathy or evidence of metastatic disease. 2. There is an indeterminate, solid enhancing nodule in the left adrenal gland. This does not meet criteria for a benign adenoma and is therefore indeterminate. 3. Small hiatal hernia. Electronically Signed   By: Kerby Moors M.D.   On: 09/23/2015 08:36   Ct Abdomen Pelvis W Contrast  09/21/2015  CLINICAL DATA:  Lower abdominal pain and pelvic pain for 2 days, nausea, history of prostate cancer and prostatectomy 2011 EXAM: CT ABDOMEN AND PELVIS WITH CONTRAST TECHNIQUE: Multidetector CT imaging of the abdomen and pelvis was performed using the standard protocol following bolus administration of intravenous contrast. CONTRAST:  165mL ISOVUE-300 IOPAMIDOL (ISOVUE-300) INJECTION 61% COMPARISON:  None. FINDINGS: Lower chest: Images of the lung bases are unremarkable. There is thickening of distal esophageal wall. Gastroesophageal reflux disease cannot be excluded. Clinical correlation is necessary. Hepatobiliary: No focal hepatic mass is noted. No intrahepatic biliary ductal dilatation. No calcified gallstones are noted within gallbladder. No CBD dilatation. Pancreas: No mass, inflammatory changes, or other significant  abnormality. Spleen: Enhanced spleen is unremarkable. Adrenals/Urinary Tract: There is 2.3 cm nodular lesion left adrenal gland. Metastatic disease cannot be excluded. The kidneys are symmetrical in enhancement. There is a cyst in upper pole of the left kidney measures 1.1 cm. Cyst in midpole  anterior aspect of the left kidney measures 2.3 cm. There is indeterminate lesion in lower pole of the left kidney measures 1.2 cm. The lesion measures about 60 pound Hounsfield units in attenuation. Although may represent a complex cyst a solid lesion cannot be excluded. Further evaluation with MRI is recommended. There is a cyst in lower pole posterior aspect of the left kidney measures 1.5 cm. There is a exophytic solid liver lesion in midpole lateral aspect of the right kidney measures 3.2 x 3.1 cm. This is consistent with renal cell carcinoma. No evidence of renal vein invasion. Nonobstructive calculus in lower pole of the right kidney measures 5 mm. No hydronephrosis or hydroureter. Delayed renal images shows bilateral renal symmetrical excretion. Bilateral visualized proximal ureter is unremarkable. No calcified calculi are noted within urinary bladder. Axial image 80 there is cystic structure in right lower pelvis just inferior to the urinary bladder indenting the right posterior wall of the urinary bladder. This measures about 3 cm. This may represent postop seroma, pelvic inclusion cyst. Less likely a seminal vesicle cyst or a bladder diverticulum. Measures 14 Hounsfield units in attenuation consistent with simple fluid. Attention should be given on follow-up examination. Stomach/Bowel: There is no small bowel obstruction. No gastric outlet obstruction. No thickened or dilated small bowel loops. Moderate stool and some gas noted within cecum and right colon. Some colonic gas noted within transverse colon. Some colonic stool noted within descending colon. Moderate case noted mid sigmoid colon. No distal colonic obstruction. Vascular/Lymphatic: Atherosclerotic calcifications of abdominal aorta and iliac arteries are noted. No aortic aneurysm. No retroperitoneal or mesenteric adenopathy. Reproductive: The patient is status post prostatectomy. No inguinal adenopathy. No pelvic adenopathy. Other: No ascites or  free abdominal air. Musculoskeletal: No destructive bony lesions are noted. No acute fractures are noted. Facet degenerative changes lower lumbar spine. Degenerative changes bilateral SI joints. IMPRESSION: 1. There is solid exophytic lesion in midpole of the right kidney laterally measures 3.2 x 3.1 cm. This is highly suspicious for renal cell carcinoma. No evidence of renal vein invasion. 2. Multiple left renal cysts are noted. There is indeterminate high-density lesion in the lower anterior cortex of the left kidney measures at least 1.2 cm. Further correlation with enhanced MRI is recommended to exclude a solid lesion. 3. Nonobstructive calcified calculus in lower pole of the right kidney measures 5 mm. No hydronephrosis or hydroureter. No calcified ureteral calculi. 4. There is thickening of distal esophageal wall. Gastroesophageal reflux disease cannot be excluded. 5. No pericecal inflammation. Normal appendix. Moderate stool and gas noted in right colon and cecum. Some colonic gas noted within transverse colon. Moderate gas noted in mid sigmoid colon. 6. Status post prostatectomy. No calcified calculi are noted within urinary bladder. There is a cystic structure and right inferior pelvis just posterior to the urinary bladder measures 2.9 cm. This may represent a postop seroma, pelvic inclusion cyst, less likely a bladder diverticulum or a seminal vesicle cyst. Attention should be given on follow-up examination. 7. There is a 2.3 cm left adrenal nodule. Metastatic disease cannot be excluded. Further correlation with MRI is recommended. These results were called by telephone at the time of interpretation on 09/21/2015 at 11:32 am to Dr. Fredia Sorrow ,  who verbally acknowledged these results. Electronically Signed   By: Lahoma Crocker M.D.   On: 09/21/2015 11:33   US Abdomen Limited Ruq  09/22/2015  CLINICAL DATA:  Right upper quadrant abdominal pain, nausea, vomiting and diarrhea for 3 days. Prostate cancer.  Right renal mass suspicious for renal cell carcinoma on CT study from 1 day prior. EXAM: US ABDOMEN LIMITED - RIGHT UPPER QUADRANT COMPARISON:  09/21/2015 CT abdomen/ pelvis. FINDINGS: Gallbladder: No gallstones or wall thickening visualized. No sonographic Murphy sign noted by sonographer. Common bile duct: Diameter: 3 mm Liver: No focal lesion identified. Within normal limits in parenchymal echogenicity. Incidentally noted is the 3.4 x 3.4 x 3.4 cm solid exophytic isoechoic renal mass in the interpolar right kidney. IMPRESSION: 1. No cholelithiasis.  No biliary ductal dilatation. 2. Normal liver. 3. Re- demonstration of exophytic solid indeterminate 3.4 cm renal mass in the interpolar right kidney, suspicious for renal cell carcinoma. Recommend further characterization of this mass at MRI abdomen without and with IV contrast. Electronically Signed   By: Ilona Sorrel M.D.   On: 09/22/2015 10:37    Microbiology: Recent Results (from the past 240 hour(s))  Gastrointestinal Panel by PCR , Stool     Status: None   Collection Time: 09/23/15  9:20 AM  Result Value Ref Range Status   Campylobacter species NOT DETECTED NOT DETECTED Final   Plesimonas shigelloides NOT DETECTED NOT DETECTED Final   Salmonella species NOT DETECTED NOT DETECTED Final   Yersinia enterocolitica NOT DETECTED NOT DETECTED Final   Vibrio species NOT DETECTED NOT DETECTED Final   Vibrio cholerae NOT DETECTED NOT DETECTED Final   Enteroaggregative E coli (EAEC) NOT DETECTED NOT DETECTED Final   Enteropathogenic E coli (EPEC) NOT DETECTED NOT DETECTED Final   Enterotoxigenic E coli (ETEC) NOT DETECTED NOT DETECTED Final   Shiga like toxin producing E coli (STEC) NOT DETECTED NOT DETECTED Final   E. coli O157 NOT DETECTED NOT DETECTED Final   Shigella/Enteroinvasive E coli (EIEC) NOT DETECTED NOT DETECTED Final   Cryptosporidium NOT DETECTED NOT DETECTED Final   Cyclospora cayetanensis NOT DETECTED NOT DETECTED Final   Entamoeba  histolytica NOT DETECTED NOT DETECTED Final   Giardia lamblia NOT DETECTED NOT DETECTED Final   Adenovirus F40/41 NOT DETECTED NOT DETECTED Final   Astrovirus NOT DETECTED NOT DETECTED Final   Norovirus GI/GII NOT DETECTED NOT DETECTED Final   Rotavirus A NOT DETECTED NOT DETECTED Final   Sapovirus (I, II, IV, and V) NOT DETECTED NOT DETECTED Final     Labs: Basic Metabolic Panel:  Recent Labs Lab 09/21/15 0859 09/22/15 0621 09/23/15 0537  NA 141 141 140  K 3.9 3.5 3.3*  CL 103 108 109  CO2 27 25 25   GLUCOSE 123* 106* 99  BUN 14 14 14   CREATININE 1.01 0.88 0.84  CALCIUM 10.4* 9.5 8.9   Liver Function Tests:  Recent Labs Lab 09/21/15 0859 09/22/15 0621  AST 38 26  ALT 21 19  ALKPHOS 83 65  BILITOT 1.0 0.8  PROT 8.4* 6.7  ALBUMIN 4.9 3.9    Recent Labs Lab 09/21/15 0859 09/22/15 0621  LIPASE 66* 37   CBC:  Recent Labs Lab 09/21/15 0859 09/22/15 0621  WBC 12.8* 10.9*  NEUTROABS 10.2*  --   HGB 14.8 12.7*  HCT 42.6 38.6*  MCV 75.4* 76.0*  PLT 204 193   Cardiac Enzymes:  Recent Labs Lab 09/21/15 1954  TROPONINI <0.03    Principal Problem:   Gastroenteritis, acute  Active Problems:   Persistent vomiting   Nausea and vomiting   Renal mass   Time coordinating discharge: 35 minutes  Signed:  Murray Hodgkins, MD [facility] 09/24/2015, 4:08 PM  By signing my name below, I, Delene Ruffini, attest that this documentation has been prepared under the direction and in the presence of Daniel P. Sarajane Jews, MD. Electronically Signed: Delene Ruffini, Scribe.  09/24/2015 10:30am  I personally performed the services described in this documentation. All medical record entries made by the scribe were at my direction. I have reviewed the chart and agree that the record reflects my personal performance and is accurate and complete. Murray Hodgkins, MD

## 2015-09-24 NOTE — Progress Notes (Signed)
Pt's IV catheter removed and intact. Pt's IV site clean dry and intact. Discharge instructions, medications and follow up appointments reviewed and discussed with patient. Pt verbalized understanding of discharge instructions including medications and follow up appointments. All questions were answered and no further questions at this time. Pt in stable condition and in no acute distress at time of discharge. Pt will be escorted by nurse tech.  

## 2016-01-12 ENCOUNTER — Emergency Department (HOSPITAL_COMMUNITY): Payer: Medicare Other

## 2016-01-12 ENCOUNTER — Encounter (HOSPITAL_COMMUNITY): Payer: Self-pay | Admitting: Emergency Medicine

## 2016-01-12 ENCOUNTER — Encounter (HOSPITAL_COMMUNITY)
Admission: EM | Disposition: A | Discharge status: Home / Self Care | Payer: Self-pay | Source: Home / Self Care | Attending: Internal Medicine

## 2016-01-12 ENCOUNTER — Emergency Department (HOSPITAL_COMMUNITY): Payer: Medicare Other | Admitting: Anesthesiology

## 2016-01-12 ENCOUNTER — Inpatient Hospital Stay (HOSPITAL_COMMUNITY)
Admission: EM | Admit: 2016-01-12 | Discharge: 2016-01-16 | DRG: 694 | Disposition: A | Discharge status: Home / Self Care | Payer: Medicare Other | Attending: Internal Medicine | Admitting: Internal Medicine

## 2016-01-12 DIAGNOSIS — I6523 Occlusion and stenosis of bilateral carotid arteries: Secondary | ICD-10-CM | POA: Diagnosis not present

## 2016-01-12 DIAGNOSIS — R112 Nausea with vomiting, unspecified: Secondary | ICD-10-CM | POA: Diagnosis not present

## 2016-01-12 DIAGNOSIS — I10 Essential (primary) hypertension: Secondary | ICD-10-CM | POA: Diagnosis not present

## 2016-01-12 DIAGNOSIS — F17210 Nicotine dependence, cigarettes, uncomplicated: Secondary | ICD-10-CM | POA: Diagnosis present

## 2016-01-12 DIAGNOSIS — N390 Urinary tract infection, site not specified: Secondary | ICD-10-CM | POA: Diagnosis not present

## 2016-01-12 DIAGNOSIS — Z8546 Personal history of malignant neoplasm of prostate: Secondary | ICD-10-CM | POA: Diagnosis not present

## 2016-01-12 DIAGNOSIS — B9620 Unspecified Escherichia coli [E. coli] as the cause of diseases classified elsewhere: Secondary | ICD-10-CM | POA: Diagnosis not present

## 2016-01-12 DIAGNOSIS — C61 Malignant neoplasm of prostate: Secondary | ICD-10-CM | POA: Insufficient documentation

## 2016-01-12 DIAGNOSIS — N132 Hydronephrosis with renal and ureteral calculous obstruction: Principal | ICD-10-CM | POA: Diagnosis present

## 2016-01-12 DIAGNOSIS — E785 Hyperlipidemia, unspecified: Secondary | ICD-10-CM | POA: Diagnosis not present

## 2016-01-12 DIAGNOSIS — Z743 Need for continuous supervision: Secondary | ICD-10-CM | POA: Diagnosis not present

## 2016-01-12 DIAGNOSIS — E782 Mixed hyperlipidemia: Secondary | ICD-10-CM | POA: Diagnosis present

## 2016-01-12 DIAGNOSIS — N201 Calculus of ureter: Secondary | ICD-10-CM | POA: Diagnosis not present

## 2016-01-12 DIAGNOSIS — Z79899 Other long term (current) drug therapy: Secondary | ICD-10-CM | POA: Diagnosis not present

## 2016-01-12 DIAGNOSIS — Z9079 Acquired absence of other genital organ(s): Secondary | ICD-10-CM | POA: Diagnosis not present

## 2016-01-12 DIAGNOSIS — N2889 Other specified disorders of kidney and ureter: Secondary | ICD-10-CM | POA: Diagnosis not present

## 2016-01-12 DIAGNOSIS — A419 Sepsis, unspecified organism: Secondary | ICD-10-CM | POA: Insufficient documentation

## 2016-01-12 DIAGNOSIS — I6529 Occlusion and stenosis of unspecified carotid artery: Secondary | ICD-10-CM | POA: Insufficient documentation

## 2016-01-12 DIAGNOSIS — Z886 Allergy status to analgesic agent status: Secondary | ICD-10-CM

## 2016-01-12 DIAGNOSIS — K219 Gastro-esophageal reflux disease without esophagitis: Secondary | ICD-10-CM | POA: Diagnosis not present

## 2016-01-12 DIAGNOSIS — N200 Calculus of kidney: Secondary | ICD-10-CM

## 2016-01-12 DIAGNOSIS — Z8249 Family history of ischemic heart disease and other diseases of the circulatory system: Secondary | ICD-10-CM | POA: Diagnosis not present

## 2016-01-12 DIAGNOSIS — N10 Acute tubulo-interstitial nephritis: Secondary | ICD-10-CM | POA: Diagnosis not present

## 2016-01-12 DIAGNOSIS — E876 Hypokalemia: Secondary | ICD-10-CM | POA: Diagnosis present

## 2016-01-12 DIAGNOSIS — R1031 Right lower quadrant pain: Secondary | ICD-10-CM | POA: Diagnosis not present

## 2016-01-12 DIAGNOSIS — N3000 Acute cystitis without hematuria: Secondary | ICD-10-CM | POA: Diagnosis not present

## 2016-01-12 DIAGNOSIS — I739 Peripheral vascular disease, unspecified: Secondary | ICD-10-CM | POA: Diagnosis not present

## 2016-01-12 DIAGNOSIS — R509 Fever, unspecified: Secondary | ICD-10-CM | POA: Diagnosis not present

## 2016-01-12 DIAGNOSIS — R1084 Generalized abdominal pain: Secondary | ICD-10-CM | POA: Diagnosis not present

## 2016-01-12 DIAGNOSIS — Z833 Family history of diabetes mellitus: Secondary | ICD-10-CM

## 2016-01-12 DIAGNOSIS — D72829 Elevated white blood cell count, unspecified: Secondary | ICD-10-CM | POA: Diagnosis present

## 2016-01-12 DIAGNOSIS — R279 Unspecified lack of coordination: Secondary | ICD-10-CM | POA: Diagnosis not present

## 2016-01-12 DIAGNOSIS — N23 Unspecified renal colic: Secondary | ICD-10-CM

## 2016-01-12 HISTORY — PX: CYSTOSCOPY W/ URETERAL STENT PLACEMENT: SHX1429

## 2016-01-12 HISTORY — DX: Other specified disorders of kidney and ureter: N28.89

## 2016-01-12 LAB — COMPREHENSIVE METABOLIC PANEL
ALBUMIN: 4.7 g/dL (ref 3.5–5.0)
ALT: 23 U/L (ref 17–63)
AST: 26 U/L (ref 15–41)
Alkaline Phosphatase: 79 U/L (ref 38–126)
Anion gap: 10 (ref 5–15)
BUN: 10 mg/dL (ref 6–20)
CHLORIDE: 104 mmol/L (ref 101–111)
CO2: 22 mmol/L (ref 22–32)
CREATININE: 0.86 mg/dL (ref 0.61–1.24)
Calcium: 9.8 mg/dL (ref 8.9–10.3)
GFR calc Af Amer: >60 mL/min (ref >60)
GFR calc non Af Amer: >60 mL/min (ref >60)
Glucose, Bld: 119 mg/dL — ABNORMAL HIGH (ref 65–99)
POTASSIUM: 3.3 mmol/L — AB (ref 3.5–5.1)
SODIUM: 136 mmol/L (ref 135–145)
Total Bilirubin: 0.8 mg/dL (ref 0.3–1.2)
Total Protein: 8 g/dL (ref 6.5–8.1)

## 2016-01-12 LAB — CBC
HEMATOCRIT: 38.9 % — AB (ref 39.0–52.0)
Hemoglobin: 13.8 g/dL (ref 13.0–17.0)
MCH: 27.1 pg (ref 26.0–34.0)
MCHC: 35.5 g/dL (ref 30.0–36.0)
MCV: 76.4 fL — AB (ref 78.0–100.0)
PLATELETS: 197 10*3/uL (ref 150–400)
RBC: 5.09 MIL/uL (ref 4.22–5.81)
RDW: 13.8 % (ref 11.5–15.5)
WBC: 18.3 10*3/uL — ABNORMAL HIGH (ref 4.0–10.5)

## 2016-01-12 LAB — URINALYSIS, ROUTINE W REFLEX MICROSCOPIC
Bilirubin Urine: NEGATIVE
GLUCOSE, UA: NEGATIVE mg/dL
Nitrite: NEGATIVE
PH: 5.5 (ref 5.0–8.0)
Specific Gravity, Urine: 1.02 (ref 1.005–1.030)

## 2016-01-12 LAB — URINE MICROSCOPIC-ADD ON

## 2016-01-12 LAB — TROPONIN I: Troponin I: <0.03 ng/mL (ref <0.03)

## 2016-01-12 LAB — I-STAT CG4 LACTIC ACID, ED: LACTIC ACID, VENOUS: 1.11 mmol/L (ref 0.5–1.9)

## 2016-01-12 LAB — LIPASE, BLOOD: LIPASE: 13 U/L (ref 11–51)

## 2016-01-12 SURGERY — CYSTOSCOPY, WITH RETROGRADE PYELOGRAM AND URETERAL STENT INSERTION
Anesthesia: General | Laterality: Right

## 2016-01-12 MED ORDER — SUCCINYLCHOLINE CHLORIDE 20 MG/ML IJ SOLN
INTRAMUSCULAR | Status: DC | PRN
  Administered 2016-01-12: 100 mg via INTRAVENOUS

## 2016-01-12 MED ORDER — FENTANYL CITRATE (PF) 100 MCG/2ML IJ SOLN
INTRAMUSCULAR | Status: DC | PRN
  Administered 2016-01-12 (×2): 50 ug via INTRAVENOUS

## 2016-01-12 MED ORDER — LIDOCAINE HCL (PF) 2 % IJ SOLN
INTRAMUSCULAR | Status: DC | PRN
  Administered 2016-01-12: 40 mg via INTRADERMAL

## 2016-01-12 MED ORDER — ONDANSETRON HCL 4 MG/2ML IJ SOLN
4.0000 mg | INTRAMUSCULAR | Status: AC | PRN
  Administered 2016-01-12 (×2): 4 mg via INTRAVENOUS
  Filled 2016-01-12 (×2): qty 2

## 2016-01-12 MED ORDER — OXYCODONE HCL 5 MG PO TABS
5.0000 mg | ORAL_TABLET | ORAL | Status: DC | PRN
  Administered 2016-01-13 – 2016-01-14 (×2): 5 mg via ORAL
  Filled 2016-01-12 (×2): qty 1

## 2016-01-12 MED ORDER — PRAVASTATIN SODIUM 10 MG PO TABS
5.0000 mg | ORAL_TABLET | Freq: Every day | ORAL | Status: DC
  Administered 2016-01-13 – 2016-01-15 (×4): 5 mg via ORAL
  Filled 2016-01-12 (×4): qty 0.5

## 2016-01-12 MED ORDER — MIDAZOLAM HCL 2 MG/2ML IJ SOLN
INTRAMUSCULAR | Status: AC
  Filled 2016-01-12: qty 2

## 2016-01-12 MED ORDER — FAMOTIDINE IN NACL 20-0.9 MG/50ML-% IV SOLN
20.0000 mg | Freq: Once | INTRAVENOUS | Status: AC
  Administered 2016-01-12: 20 mg via INTRAVENOUS
  Filled 2016-01-12: qty 50

## 2016-01-12 MED ORDER — EPHEDRINE SULFATE 50 MG/ML IJ SOLN
INTRAMUSCULAR | Status: DC | PRN
  Administered 2016-01-12: 10 mg via INTRAVENOUS

## 2016-01-12 MED ORDER — DEXTROSE 5 % IV SOLN
1.0000 g | Freq: Once | INTRAVENOUS | Status: AC
  Administered 2016-01-12: 1 g via INTRAVENOUS
  Filled 2016-01-12: qty 10

## 2016-01-12 MED ORDER — EPHEDRINE SULFATE 50 MG/ML IJ SOLN
INTRAMUSCULAR | Status: AC
  Filled 2016-01-12: qty 1

## 2016-01-12 MED ORDER — IOPAMIDOL (ISOVUE-300) INJECTION 61%
100.0000 mL | Freq: Once | INTRAVENOUS | Status: AC | PRN
  Administered 2016-01-12: 100 mL via INTRAVENOUS

## 2016-01-12 MED ORDER — LIDOCAINE HCL (CARDIAC) 20 MG/ML IV SOLN
INTRAVENOUS | Status: AC
  Filled 2016-01-12: qty 5

## 2016-01-12 MED ORDER — SODIUM CHLORIDE 0.9 % IV SOLN
INTRAVENOUS | Status: DC
  Administered 2016-01-12: 15:00:00 via INTRAVENOUS

## 2016-01-12 MED ORDER — MORPHINE SULFATE (PF) 4 MG/ML IV SOLN: 4.0000 mg | INTRAVENOUS | Status: DC | PRN

## 2016-01-12 MED ORDER — DIATRIZOATE MEGLUMINE & SODIUM 66-10 % PO SOLN
ORAL | Status: AC
  Filled 2016-01-12: qty 30

## 2016-01-12 MED ORDER — ONDANSETRON HCL 4 MG/2ML IJ SOLN
INTRAMUSCULAR | Status: DC | PRN
  Administered 2016-01-12: 4 mg via INTRAVENOUS

## 2016-01-12 MED ORDER — MIDAZOLAM HCL 2 MG/2ML IJ SOLN: 0.5000 mg | Freq: Once | INTRAMUSCULAR | Status: DC | PRN

## 2016-01-12 MED ORDER — LACTATED RINGERS IV SOLN
INTRAVENOUS | Status: DC | PRN
  Administered 2016-01-12: 21:00:00 via INTRAVENOUS

## 2016-01-12 MED ORDER — SODIUM CHLORIDE 0.9 % IR SOLN
Status: DC | PRN
  Administered 2016-01-12: 3000 mL

## 2016-01-12 MED ORDER — ENOXAPARIN SODIUM 40 MG/0.4ML ~~LOC~~ SOLN
40.0000 mg | Freq: Every day | SUBCUTANEOUS | Status: DC
  Administered 2016-01-13 – 2016-01-15 (×4): 40 mg via SUBCUTANEOUS
  Filled 2016-01-12 (×4): qty 0.4

## 2016-01-12 MED ORDER — POTASSIUM CHLORIDE 10 MEQ/100ML IV SOLN
10.0000 meq | INTRAVENOUS | Status: AC
  Administered 2016-01-13 (×4): 10 meq via INTRAVENOUS
  Filled 2016-01-12 (×7): qty 100

## 2016-01-12 MED ORDER — SODIUM CHLORIDE 0.9 % IJ SOLN
INTRAMUSCULAR | Status: AC
  Filled 2016-01-12: qty 10

## 2016-01-12 MED ORDER — OXYBUTYNIN CHLORIDE ER 10 MG PO TB24
10.0000 mg | ORAL_TABLET | Freq: Every day | ORAL | Status: DC
  Administered 2016-01-14 – 2016-01-15 (×2): 10 mg via ORAL
  Filled 2016-01-12 (×4): qty 1

## 2016-01-12 MED ORDER — PROPOFOL 10 MG/ML IV BOLUS
INTRAVENOUS | Status: AC
  Filled 2016-01-12: qty 20

## 2016-01-12 MED ORDER — FENTANYL CITRATE (PF) 100 MCG/2ML IJ SOLN
INTRAMUSCULAR | Status: AC
  Filled 2016-01-12: qty 2

## 2016-01-12 MED ORDER — LACTATED RINGERS IV SOLN
INTRAVENOUS | Status: DC
  Administered 2016-01-12 – 2016-01-13 (×4): via INTRAVENOUS

## 2016-01-12 MED ORDER — MIDAZOLAM HCL 5 MG/5ML IJ SOLN
INTRAMUSCULAR | Status: DC | PRN
  Administered 2016-01-12: 2 mg via INTRAVENOUS

## 2016-01-12 MED ORDER — ONDANSETRON HCL 4 MG/2ML IJ SOLN
INTRAMUSCULAR | Status: AC
  Filled 2016-01-12: qty 2

## 2016-01-12 MED ORDER — DOCUSATE SODIUM 100 MG PO CAPS
100.0000 mg | ORAL_CAPSULE | Freq: Two times a day (BID) | ORAL | Status: DC
  Administered 2016-01-13 – 2016-01-15 (×5): 100 mg via ORAL
  Filled 2016-01-12 (×7): qty 1

## 2016-01-12 MED ORDER — ACETAMINOPHEN 325 MG PO TABS: 650.0000 mg | ORAL_TABLET | ORAL | Status: DC | PRN

## 2016-01-12 MED ORDER — MEPERIDINE HCL 50 MG/ML IJ SOLN: 6.2500 mg | INTRAMUSCULAR | Status: DC | PRN

## 2016-01-12 MED ORDER — IOPAMIDOL (ISOVUE-300) INJECTION 61%: 75.0000 mL | Freq: Once | INTRAVENOUS | Status: DC | PRN

## 2016-01-12 MED ORDER — PROPOFOL 10 MG/ML IV BOLUS
INTRAVENOUS | Status: DC | PRN
  Administered 2016-01-12: 50 mg via INTRAVENOUS
  Administered 2016-01-12: 150 mg via INTRAVENOUS

## 2016-01-12 MED ORDER — PROMETHAZINE HCL 25 MG/ML IJ SOLN
6.2500 mg | INTRAMUSCULAR | Status: DC | PRN
Start: 2016-01-12 — End: 2016-01-12

## 2016-01-12 MED ORDER — DEXTROSE 5 % IV SOLN
2.0000 g | INTRAVENOUS | Status: DC
  Administered 2016-01-13 – 2016-01-15 (×3): 2 g via INTRAVENOUS
  Filled 2016-01-12 (×3): qty 2

## 2016-01-12 MED ORDER — FENTANYL CITRATE (PF) 100 MCG/2ML IJ SOLN: 25.0000 ug | INTRAMUSCULAR | Status: DC | PRN

## 2016-01-12 MED ORDER — IOHEXOL 300 MG/ML  SOLN
INTRAMUSCULAR | Status: DC | PRN
  Administered 2016-01-12: 10 mL via URETHRAL

## 2016-01-12 SURGICAL SUPPLY — 12 items
BAG URO CATCHER STRL LF (MISCELLANEOUS) ×2 IMPLANT
CATH INTERMIT  6FR 70CM (CATHETERS) ×2 IMPLANT
CLOTH BEACON ORANGE TIMEOUT ST (SAFETY) ×2 IMPLANT
GLOVE BIOGEL M 8.0 STRL (GLOVE) ×6 IMPLANT
GOWN STRL REUS W/ TWL XL LVL3 (GOWN DISPOSABLE) ×2 IMPLANT
GOWN STRL REUS W/TWL XL LVL3 (GOWN DISPOSABLE) ×2
GUIDEWIRE ANG ZIPWIRE 038X150 (WIRE) ×2 IMPLANT
GUIDEWIRE STR DUAL SENSOR (WIRE) ×2 IMPLANT
MANIFOLD NEPTUNE II (INSTRUMENTS) ×2 IMPLANT
PACK CYSTO (CUSTOM PROCEDURE TRAY) ×2 IMPLANT
STENT CONTOUR 6FRX24X.038 (STENTS) ×2 IMPLANT
TUBING CONNECTING 10 (TUBING) ×2 IMPLANT

## 2016-01-12 NOTE — ED Triage Notes (Signed)
Pt c/o abdominal pain with n/v/d.

## 2016-01-12 NOTE — H&P (Signed)
History and Physical    Perry [name] [id number] DOB: 06-12-1955 DOA: 01/12/2016  PCP: Perry Sam, MD Consultants:  ? Patient coming from: ?  Chief Complaint: abd pain, n/v  HPI: Perry [name] is a 60 y.o. male with medical history significant of prostate CA s/p robotic prostatectomy in 2012 and known right renal mass from hospitalization in [id number] (uncertain f/u) presenting to [facility] with abdominal pain/n/v.  He was found to have a 74mm distal right ureteral stone and resultant right hydronephrosis with WBC 18,000 and was transferred to [facility] for urology consultation.  Hospitalist called for admission but the patient was already in the OR by the time I came to see him in the ER.  I evaluated him in the PACU and he was still anesthetized and thus unable to provide additional history.    ED Course: [facility]: IV Rocephin.  Right ureteral calculi with hydronephrosis.  After discussion with urology, plan for transfer to [facility] for stent placement.  [facility]: Urology consult, admit to hospitalist service  Review of Systems: As per HPI; otherwise 10 point review of systems reviewed and negative.     Past Medical History:  Diagnosis Date  . Bilateral carotid artery stenosis    20-39%  . History of prostate biopsy 11/25/09  . Hyperlipidemia   . Prostate cancer ([place])   . PSA elevation   . Right renal mass     Past Surgical History:  Procedure Laterality Date  . radical prostactectomy  08/07/10    Social History   Social History  . Marital status: Single    Spouse name: N/A  . Number of children: N/A  . Years of education: N/A   Occupational History  . Not on file.   Social History Main Topics  . Smoking status: Current Every Day Smoker    Packs/day: 1.00    Types: Cigarettes  . Smokeless tobacco: Never Used  . Alcohol use No  . Drug use: No     Comment: former marijuana  . Sexual activity: Not on file   Other Topics Concern  . Not on file   Social History Narrative  . No  narrative on file    Allergies  Allergen Reactions  . Aspirin Other (See Comments)    unknown    Family History  Problem Relation Age of Onset  . Diabetes Mother   . Hypertension Mother   . Hypertension      family history     Prior to Admission medications   Medication Sig Start Date End Date Taking? Authorizing Provider  Cholecalciferol 1000 units tablet Take 1,000 Units by mouth daily.   Yes Historical Provider, MD  Omega-3 Fatty Acids (FISH OIL) 1000 MG CAPS Take 1 capsule by mouth 2 (two) times daily.   Yes Historical Provider, MD  oxybutynin (DITROPAN-XL) 10 MG 24 hr tablet Take 20 mg by mouth daily.   Yes Historical Provider, MD  pravastatin (PRAVACHOL) 10 MG tablet Take 5 mg by mouth at bedtime.   Yes Historical Provider, MD  prochlorperazine (COMPAZINE) 10 MG tablet Take 1 tablet by mouth daily as needed for nausea or vomiting.  09/20/15   Historical Provider, MD  sildenafil (VIAGRA) 100 MG tablet Take 100 mg by mouth daily as needed for erectile dysfunction.    Historical Provider, MD    Physical Exam: Vitals:   01/12/16 1900 01/12/16 2242 01/12/16 2245 01/12/16 2300  BP: 145/71 129/71 123/73 129/74  Pulse: 85 95 89 73  Resp: 18 (!) 22 (!)  29 (!) 24  Temp: 99.2 F (37.3 C) 99.1 F (37.3 C)    TempSrc: Oral     SpO2: 94% 100% 100% 95%  Weight:      Height:         General:  Appears calm and comfortable and is somnolent in PACU and in NAD Eyes: closed ENT: , mmm Neck: no LAD, masses or thyromegaly, no carotid bruits Cardiovascular:  RRR, no m/r/g. No LE edema.  Respiratory:  CTA bilaterally, no w/r/r. Normal respiratory effort. Abdomen:  soft, ntnd, NABS Skin:  no rash or induration seen on limited exam Musculoskeletal:  grossly normal tone BUE/BLE, no bony abnormality Psychiatric: unable to assess Neurologic: unable to assess   Labs on Admission: I have personally reviewed following labs and imaging studies  CBC:  Recent Labs Lab 01/12/16 1410    WBC 18.3*  HGB 13.8  HCT 38.9*  MCV 76.4*  PLT [id number]   Basic Metabolic Panel:  Recent Labs Lab 01/12/16 1410  NA 136  K 3.3*  CL 104  CO2 22  GLUCOSE 119*  BUN 10  CREATININE 0.86  CALCIUM 9.8   GFR: Estimated Creatinine Clearance: 106.1 mL/min (by C-G formula based on SCr of 0.86 mg/dL). Liver Function Tests:  Recent Labs Lab 01/12/16 1410  AST 26  ALT 23  ALKPHOS 79  BILITOT 0.8  PROT 8.0  ALBUMIN 4.7    Recent Labs Lab 01/12/16 1410  LIPASE 13   No results for input(s): AMMONIA in the last 168 hours. Coagulation Profile: No results for input(s): INR, PROTIME in the last 168 hours. Cardiac Enzymes:  Recent Labs Lab 01/12/16 1410  TROPONINI <0.03   BNP (last 3 results) No results for input(s): PROBNP in the last 8760 hours. HbA1C: No results for input(s): HGBA1C in the last 72 hours. CBG: No results for input(s): GLUCAP in the last 168 hours. Lipid Profile: No results for input(s): CHOL, HDL, LDLCALC, TRIG, CHOLHDL, LDLDIRECT in the last 72 hours. Thyroid Function Tests: No results for input(s): TSH, T4TOTAL, FREET4, T3FREE, THYROIDAB in the last 72 hours. Anemia Panel: No results for input(s): VITAMINB12, FOLATE, FERRITIN, TIBC, IRON, RETICCTPCT in the last 72 hours. Urine analysis:    Component Value Date/Time   COLORURINE YELLOW 01/12/2016 1600   APPEARANCEUR HAZY (A) 01/12/2016 1600   LABSPEC 1.020 01/12/2016 1600   PHURINE 5.5 01/12/2016 1600   GLUCOSEU NEGATIVE 01/12/2016 1600   HGBUR LARGE (A) 01/12/2016 1600   BILIRUBINUR NEGATIVE 01/12/2016 1600   KETONESUR TRACE (A) 01/12/2016 1600   PROTEINUR TRACE (A) 01/12/2016 1600   UROBILINOGEN 1.0 07/15/2009 1644   NITRITE NEGATIVE 01/12/2016 1600   LEUKOCYTESUR LARGE (A) 01/12/2016 1600    Creatinine Clearance: Estimated Creatinine Clearance: 106.1 mL/min (by C-G formula based on SCr of 0.86 mg/dL).  Sepsis Labs: @LABRCNTIP (procalcitonin:4,lacticidven:4) )No results found for this  or any previous visit (from the past 240 hour(s)).   Radiological Exams on Admission: Dg Chest 2 View  Result Date: 01/12/2016 CLINICAL DATA:  Low-grade fever, smoker EXAM: CHEST  2 VIEW COMPARISON:  Radiograph 09/21/2015 FINDINGS: Exam is lordotic. No effusion, infiltrate pneumothorax. Normal cardiac silhouette. Low lung volumes. IMPRESSION: No acute cardiopulmonary process.  Low lung volumes. Electronically Signed   By: Suzy Bouchard M.D.   On: 01/12/2016 15:23   Ct Abdomen Pelvis W Contrast  Result Date: 01/12/2016 CLINICAL DATA:  Generalized abdomen pain, nausea vomiting since yesterday EXAM: CT ABDOMEN AND PELVIS WITH CONTRAST TECHNIQUE: Multidetector CT imaging of the abdomen and  pelvis was performed using the standard protocol following bolus administration of intravenous contrast. CONTRAST:  175mL ISOVUE-300 IOPAMIDOL (ISOVUE-300) INJECTION 61% COMPARISON:  September 21, 2015 CT abdomen pelvis, MRI of the abdomen September 22, 2015 FINDINGS: Lower chest: There is mild atelectasis of the posterior lung bases. There is no focal pneumonia or pleural effusion. There is a small hiatal hernia. The heart size is normal. Hepatobiliary: The liver and gallbladder are normal. No masses or other significant abnormality. Pancreas: No mass, inflammatory changes, or other significant abnormality. Spleen: Within normal limits in size and appearance. Adrenals/Urinary Tract: There is a 2.3 cm mass in the left adrenal gland unchanged compared to prior MRI of abdomen and prior CT of abdomen of April 2017. The right adrenal gland is normal. There is a 3 cm enhancing mass in the lateral midpole right kidney unchanged compared to prior exams. This was found to be consistent with renal cell carcinoma on prior MRI of abdomen dated April 2017. There are simple cysts within the left kidney. There is right hydroureteronephrosis due to obstruction by a 6 mm stone in the distal right ureter. Nonobstructing stone is identified within  the left kidney. Stomach/Bowel: No evidence of obstruction, inflammatory process, or abnormal fluid collections. Vascular/Lymphatic: No pathologically enlarged lymph nodes. No evidence of abdominal aortic aneurysm. Atherosclerosis of the abdominal aorta is identified. Reproductive: No mass or other significant abnormality. Other: None. Musculoskeletal: No suspicious bone lesions identified. Degenerative joint changes of the spine are noted. IMPRESSION: Right hydroureteronephrosis due to obstruction by 6 mm stone in the distal right ureter likely accounting for the patient's acute symptoms. Solid exophytic lesion in the midpole right kidney suspicious for renal cell carcinoma previously worked up with MRI of the abdomen September 22, 2015. No evidence of renal vein invasion. Indeterminate lesion in the left adrenal gland unchanged compared to prior MRI of the abdomen dated September 22, 2015. Electronically Signed   By: Abelardo Diesel M.D.   On: 01/12/2016 16:37   Dg C-arm 1-60 Min-no Report  Result Date: 01/12/2016 CLINICAL DATA: surgery C-ARM 1-60 MINUTES Fluoroscopy was utilized by the requesting physician.  No radiographic interpretation.    EKG: Independently reviewed.  NSR with rate 80; nonspecific ST changes with no evidence of acute ischemia  Assessment/Plan Principal Problem:   Right ureteral stone Active Problems:   Renal mass   Hyperlipidemia   Hypokalemia   Leukocytosis   UTI (urinary tract infection)   Right ureteral stone with UTI -Patient with obstructing stone causing infection -Transferred from [facility] to [facility] for urology consultation -Patient has been to the OR and had R ureteral stent placement -Will place in observation status on Med Surg unit on hospitalist service -Continue Rocephin -Leukocytosis thought to be related to UTI; otherwise no current concern for sepsis (normal lactate, not hypotensive)  Hypokalemia -Replete -Follow up with BMP  Renal mass -Following by [facility], may be  undergoing evaluation through them but that information is not available at this time  Hyperlipidemia -Continue Pravachol -Hold Fish oil    DVT prophylaxis: Lovenox Code Status: Full  Family Communication: Not available at this time Disposition Plan: Home once clinically improved Consults called: Urology Admission status: Observation to med surg   Karmen Bongo MD [facility]  If 7PM-7AM, please contact night-coverage [email or web address] Password [email or web address]  01/12/2016, 11:12 PM

## 2016-01-12 NOTE — Anesthesia Preprocedure Evaluation (Signed)
Anesthesia Evaluation  Patient identified by MRN, date of birth, ID band Patient awake    Reviewed: Allergy & Precautions, NPO status , Patient's Chart, lab work & pertinent test results  History of Anesthesia Complications Negative for: history of anesthetic complications  Airway Mallampati: I  TM Distance: >3 FB Neck ROM: Full    Dental  (+) Dental Advisory Given   Pulmonary Current Smoker,    breath sounds clear to auscultation       Cardiovascular + Peripheral Vascular Disease   Rhythm:Regular Rate:Normal     Neuro/Psych negative neurological ROS     GI/Hepatic Neg liver ROS, GERD  Medicated and Controlled,  Endo/Other  negative endocrine ROS  Renal/GU negative Renal ROS     Musculoskeletal   Abdominal   Peds  Hematology negative hematology ROS (+)   Anesthesia Other Findings   Reproductive/Obstetrics                             Anesthesia Physical Anesthesia Plan  ASA: III  Anesthesia Plan: General   Post-op Pain Management:    Induction: Intravenous and Rapid sequence  Airway Management Planned: Oral ETT  Additional Equipment:   Intra-op Plan:   Post-operative Plan:   Informed Consent: I have reviewed the patients History and Physical, chart, labs and discussed the procedure including the risks, benefits and alternatives for the proposed anesthesia with the patient or authorized representative who has indicated his/her understanding and acceptance.   Dental advisory given  Plan Discussed with: Surgeon and CRNA  Anesthesia Plan Comments: (Plan routine monitors, GETA)        Anesthesia Quick Evaluation

## 2016-01-12 NOTE — ED Provider Notes (Signed)
[facility] DEPT Provider Note   CSN: [id number] Arrival date & time: 01/12/16  1324  First Provider Contact:  None       History   Chief Complaint Chief Complaint  Patient presents with  . Abdominal Pain    HPI Perry [name] is a 60 y.o. male.  HPI  Pt was seen at 1405.  Per pt, c/o gradual onset and persistence of constant generalized abd "pain" since last evening approximately 2000/2030.  Has been associated with multiple intermittent episodes of N/V.  Denies diarrhea, no fevers, no back pain, no rash, no CP/SOB, no black or blood in stools or emesis.       Past Medical History:  Diagnosis Date  . Bilateral carotid artery stenosis    20-39%  . History of prostate biopsy 11/25/09  . Hyperlipidemia   . Prostate cancer ([place])   . PSA elevation   . Right renal mass     Patient Active Problem List   Diagnosis Date Noted  . Gastroenteritis, acute 09/24/2015  . Persistent vomiting 09/21/2015  . Nausea and vomiting 09/21/2015  . Renal mass 09/21/2015  . BLOOD IN STOOL, OCCULT 08/07/2007  . ANEMIA, IRON DEFICIENCY 04/02/2007  . MELENA 04/01/2007    Past Surgical History:  Procedure Laterality Date  . radical prostactectomy  08/07/10       Home Medications    Prior to Admission medications   Medication Sig Start Date End Date Taking? Authorizing Provider  Cholecalciferol 1000 units tablet Take 1,000 Units by mouth daily.    Historical Provider, MD  Omega-3 Fatty Acids (FISH OIL) 1000 MG CAPS Take 1 capsule by mouth 2 (two) times daily.    Historical Provider, MD  oxybutynin (DITROPAN-XL) 10 MG 24 hr tablet Take 20 mg by mouth daily.    Historical Provider, MD  pravastatin (PRAVACHOL) 10 MG tablet Take 5 mg by mouth at bedtime.    Historical Provider, MD  prochlorperazine (COMPAZINE) 10 MG tablet Take 1 tablet by mouth daily as needed for nausea or vomiting.  09/20/15   Historical Provider, MD  sildenafil (VIAGRA) 100 MG tablet Take 100 mg by mouth daily as needed  for erectile dysfunction.    Historical Provider, MD    Family History Family History  Problem Relation Age of Onset  . Diabetes Mother   . Hypertension Mother   . Hypertension      family history     Social History Social History  Substance Use Topics  . Smoking status: Current Every Day Smoker    Packs/day: 1.00    Types: Cigarettes  . Smokeless tobacco: Never Used  . Alcohol use No     Allergies   Aspirin   Review of Systems Review of Systems ROS: Statement: All systems negative except as marked or noted in the HPI; Constitutional: Negative for fever and chills. ; ; Eyes: Negative for eye pain, redness and discharge. ; ; ENMT: Negative for ear pain, hoarseness, nasal congestion, sinus pressure and sore throat. ; ; Cardiovascular: Negative for chest pain, palpitations, diaphoresis, dyspnea and peripheral edema. ; ; Respiratory: Negative for cough, wheezing and stridor. ; ; Gastrointestinal: +N/V, abd pain. Negative for diarrhea, blood in stool, hematemesis, jaundice and rectal bleeding. . ; ; Genitourinary: Negative for dysuria, flank pain and hematuria. ; ; Musculoskeletal: Negative for back pain and neck pain. Negative for swelling and trauma.; ; Skin: Negative for pruritus, rash, abrasions, blisters, bruising and skin lesion.; ; Neuro: Negative for headache, lightheadedness and neck stiffness.  Negative for weakness, altered level of consciousness, altered mental status, extremity weakness, paresthesias, involuntary movement, seizure and syncope.       Physical Exam Updated Vital Signs BP 127/69 (BP Location: Left Arm)   Pulse 75   Temp 99.6 F (37.6 C) (Oral)   Resp (!) 28   Ht 5\' 9"  (1.753 m)   Wt 219 lb (99.3 kg)   SpO2 100%   BMI 32.34 kg/m   Physical Exam 1410: Physical examination:  Nursing notes reviewed; Vital signs and O2 SAT reviewed;  Constitutional: Well developed, Well nourished, Uncomfortable appearing.; Head:  Normocephalic, atraumatic; Eyes: EOMI,  PERRL, No scleral icterus; ENMT: Mouth and pharynx normal, Mucous membranes dry; Neck: Supple, Full range of motion, No lymphadenopathy; Cardiovascular: Regular rate and rhythm, No gallop; Respiratory: Breath sounds clear & equal bilaterally, No wheezes.  Speaking full sentences with ease, Normal respiratory effort/excursion; Chest: Nontender, Movement normal; Abdomen: Soft, +diffuse tenderness to palp. No rebound. Nondistended, Normal bowel sounds; Genitourinary: No CVA tenderness; Extremities: Pulses normal, No tenderness, No edema, No calf edema or asymmetry.; Neuro: AA&Ox3, Major CN grossly intact.  Speech clear. No gross focal motor deficits in extremities.; Skin: Color normal, Warm, Dry.   ED Treatments / Results  Labs (all labs ordered are listed, but only abnormal results are displayed)   EKG  EKG Interpretation  Date/Time:  Thursday January 12 2016 14:18:19 EDT Ventricular Rate:  80 PR Interval:    QRS Duration: 93 QT Interval:  409 QTC Calculation: 472 R Axis:   76 Text Interpretation:  Sinus rhythm Right atrial enlargement Minimal ST depression, diffuse leads Artifact When compared with ECG of 09/21/2015 Nonspecific ST and T wave abnormality is now Present Confirmed by [facility]  MD, Nunzio Cory [phone]) on 01/12/2016 2:38:58 PM       Radiology   Procedures Procedures (including critical care time)  Medications Ordered in ED Medications  0.9 %  sodium chloride infusion (not administered)  famotidine (PEPCID) IVPB 20 mg premix (not administered)  ondansetron (ZOFRAN) injection 4 mg (not administered)     Initial Impression / Assessment and Plan / ED Course  I have reviewed the triage vital signs and the nursing notes.  Pertinent labs & imaging results that were available during my care of the patient were reviewed by me and considered in my medical decision making (see chart for details).  MDM Reviewed: previous chart, nursing note and vitals Reviewed previous: labs and  ECG Interpretation: labs, ECG, x-ray and CT scan   Results for orders placed or performed during the hospital encounter of 01/12/16  Lipase, blood  Result Value Ref Range   Lipase 13 11 - 51 U/L  Comprehensive metabolic panel  Result Value Ref Range   Sodium 136 135 - 145 mmol/L   Potassium 3.3 (L) 3.5 - 5.1 mmol/L   Chloride 104 101 - 111 mmol/L   CO2 22 22 - 32 mmol/L   Glucose, Bld 119 (H) 65 - 99 mg/dL   BUN 10 6 - 20 mg/dL   Creatinine, Ser 0.86 0.61 - 1.24 mg/dL   Calcium 9.8 8.9 - 10.3 mg/dL   Total Protein 8.0 6.5 - 8.1 g/dL   Albumin 4.7 3.5 - 5.0 g/dL   AST 26 15 - 41 U/L   ALT 23 17 - 63 U/L   Alkaline Phosphatase 79 38 - 126 U/L   Total Bilirubin 0.8 0.3 - 1.2 mg/dL   GFR calc non Af Amer >60 >60 mL/min   GFR calc Af  Amer >60 >60 mL/min   Anion gap 10 5 - 15  CBC  Result Value Ref Range   WBC 18.3 (H) 4.0 - 10.5 K/uL   RBC 5.09 4.22 - 5.81 MIL/uL   Hemoglobin 13.8 13.0 - 17.0 g/dL   HCT 38.9 (L) 39.0 - 52.0 %   MCV 76.4 (L) 78.0 - 100.0 fL   MCH 27.1 26.0 - 34.0 pg   MCHC 35.5 30.0 - 36.0 g/dL   RDW 13.8 11.5 - 15.5 %   Platelets 197 150 - 400 K/uL  Urinalysis, Routine w reflex microscopic  Result Value Ref Range   Color, Urine YELLOW YELLOW   APPearance HAZY (A) CLEAR   Specific Gravity, Urine 1.020 1.005 - 1.030   pH 5.5 5.0 - 8.0   Glucose, UA NEGATIVE NEGATIVE mg/dL   Hgb urine dipstick LARGE (A) NEGATIVE   Bilirubin Urine NEGATIVE NEGATIVE   Ketones, ur TRACE (A) NEGATIVE mg/dL   Protein, ur TRACE (A) NEGATIVE mg/dL   Nitrite NEGATIVE NEGATIVE   Leukocytes, UA LARGE (A) NEGATIVE  Troponin I  Result Value Ref Range   Troponin I <0.03 <0.03 ng/mL  Urine microscopic-add on  Result Value Ref Range   Squamous Epithelial / LPF 0-5 (A) NONE SEEN   WBC, UA TOO NUMEROUS TO COUNT 0 - 5 WBC/hpf   RBC / HPF 6-30 0 - 5 RBC/hpf   Bacteria, UA MANY (A) NONE SEEN   Dg Chest 2 View Result Date: 01/12/2016 CLINICAL DATA:  Low-grade fever, smoker EXAM: CHEST   2 VIEW COMPARISON:  Radiograph 09/21/2015 FINDINGS: Exam is lordotic. No effusion, infiltrate pneumothorax. Normal cardiac silhouette. Low lung volumes. IMPRESSION: No acute cardiopulmonary process.  Low lung volumes. Electronically Signed   By: Suzy Bouchard M.D.   On: 01/12/2016 15:23   Ct Abdomen Pelvis W Contrast Result Date: 01/12/2016 CLINICAL DATA:  Generalized abdomen pain, nausea vomiting since yesterday EXAM: CT ABDOMEN AND PELVIS WITH CONTRAST TECHNIQUE: Multidetector CT imaging of the abdomen and pelvis was performed using the standard protocol following bolus administration of intravenous contrast. CONTRAST:  166mL ISOVUE-300 IOPAMIDOL (ISOVUE-300) INJECTION 61% COMPARISON:  September 21, 2015 CT abdomen pelvis, MRI of the abdomen September 22, 2015 FINDINGS: Lower chest: There is mild atelectasis of the posterior lung bases. There is no focal pneumonia or pleural effusion. There is a small hiatal hernia. The heart size is normal. Hepatobiliary: The liver and gallbladder are normal. No masses or other significant abnormality. Pancreas: No mass, inflammatory changes, or other significant abnormality. Spleen: Within normal limits in size and appearance. Adrenals/Urinary Tract: There is a 2.3 cm mass in the left adrenal gland unchanged compared to prior MRI of abdomen and prior CT of abdomen of April 2017. The right adrenal gland is normal. There is a 3 cm enhancing mass in the lateral midpole right kidney unchanged compared to prior exams. This was found to be consistent with renal cell carcinoma on prior MRI of abdomen dated April 2017. There are simple cysts within the left kidney. There is right hydroureteronephrosis due to obstruction by a 6 mm stone in the distal right ureter. Nonobstructing stone is identified within the left kidney. Stomach/Bowel: No evidence of obstruction, inflammatory process, or abnormal fluid collections. Vascular/Lymphatic: No pathologically enlarged lymph nodes. No evidence of  abdominal aortic aneurysm. Atherosclerosis of the abdominal aorta is identified. Reproductive: No mass or other significant abnormality. Other: None. Musculoskeletal: No suspicious bone lesions identified. Degenerative joint changes of the spine are noted. IMPRESSION: Right hydroureteronephrosis  due to obstruction by 6 mm stone in the distal right ureter likely accounting for the patient's acute symptoms. Solid exophytic lesion in the midpole right kidney suspicious for renal cell carcinoma previously worked up with MRI of the abdomen September 22, 2015. No evidence of renal vein invasion. Indeterminate lesion in the left adrenal gland unchanged compared to prior MRI of the abdomen dated September 22, 2015. Electronically Signed   By: Abelardo Diesel M.D.   On: 01/12/2016 16:37    1805:   +UTI, UC pending; will dose IV rocephin. +right ureteral calculi with hydroureteronephrosis. Will admit. Dx and testing d/w pt and family.  Questions answered.  Verb understanding, agreeable to transfer to [facility].  T/C to Uro Dr. Karsten Ro, case discussed, including:  HPI, pertinent PM/SHx, VS/PE, dx testing, ED course and treatment:  pt will need stent placed, requests to transfer to [facility] ED and have ED call his resident [facility]. T[facility]C to [facility] EDP Dr. Tamera Punt and report given.      Final Clinical Impressions(s) / ED Diagnoses   Final diagnoses:  None    New Prescriptions New Prescriptions   No medications on file     Francine Graven, DO 01/15/16 1425

## 2016-01-12 NOTE — Consult Note (Signed)
Urology Consult   Physician requesting consult: Dr. Malvin Johns  Reason for consult: right ureteral stone  History of Present Illness: Perry [name] is a 60 y.o.with history of Gleason 7 prostate cancer s/p robotic prostatectomy by Dr. Alinda Money in 2012, known right renal mass who presented to the [facility] ED earlier today with abdominal pain and nausea/vomiting. The severe pain and nausea started last night but has been having mild pain for two weeks. A CT scan showed a 62mm distal right ureteral stone and right hydronephrosis, in addition to known right renal mass. His labs were significant for WBC of 18K and UA concerning for infection with many bacteria, large LE. Endorses chills but no measured fevers. Vitals otherwise stable. Creatinine appears to be at baseline of 0.8-0.9. Denies prior kidney stones. He was being seen at the [facility] for his right renal mass.   Past Medical History:  Diagnosis Date  . Bilateral carotid artery stenosis    20-39%  . History of prostate biopsy 11/25/09  . Hyperlipidemia   . Prostate cancer ([place])   . PSA elevation   . Right renal mass     Past Surgical History:  Procedure Laterality Date  . radical prostactectomy  08/07/10    Current Hospital Medications:   Scheduled Meds: . diatrizoate meglumine-sodium       Continuous Infusions: . sodium chloride Stopped (01/12/16 1903)   PRN Meds:.iopamidol, morphine injection  Allergies:  Allergies  Allergen Reactions  . Aspirin Other (See Comments)    unknown    Family History  Problem Relation Age of Onset  . Diabetes Mother   . Hypertension Mother   . Hypertension      family history     Social History:  reports that he has been smoking Cigarettes.  He has been smoking about 1.00 pack per day. He has never used smokeless tobacco. He reports that he does not drink alcohol or use drugs.  ROS: A complete review of systems was performed.  All systems are negative except for pertinent  findings as noted.  Physical Exam:  Vital signs in last 24 hours: Temp:  [99.2 F (37.3 C)-99.6 F (37.6 C)] 99.2 F (37.3 C) (08/03 1900) Pulse Rate:  [63-106] 85 (08/03 1900) Resp:  [18-28] 18 (08/03 1900) BP: (127-195)/(69-109) 145/71 (08/03 1900) SpO2:  [92 %-100 %] 94 % (08/03 1900) Weight:  [99.3 kg (219 lb)] 99.3 kg (219 lb) (08/03 1339) Constitutional:  Alert and oriented, appears uncomfortable Respiratory: Normal respiratory effort  GI: Abdomen is soft, tender in RUQ, nondistended, no abdominal masses, prior well healed incisions GU: Right CVA tenderness Neurologic: Grossly intact, no focal deficits Psychiatric: Normal mood and affect  Laboratory Data:   Recent Labs  01/12/16 1410  WBC 18.3*  HGB 13.8  HCT 38.9*  PLT 197     Recent Labs  01/12/16 1410  NA 136  K 3.3*  CL 104  GLUCOSE 119*  BUN 10  CALCIUM 9.8  CREATININE 0.86     Results for orders placed or performed during the hospital encounter of 01/12/16 (from the past 24 hour(s))  Lipase, blood     Status: None   Collection Time: 01/12/16  2:10 PM  Result Value Ref Range   Lipase 13 11 - 51 U/L  Comprehensive metabolic panel     Status: Abnormal   Collection Time: 01/12/16  2:10 PM  Result Value Ref Range   Sodium 136 135 - 145 mmol/L   Potassium 3.3 (L) 3.5 -  5.1 mmol/L   Chloride 104 101 - 111 mmol/L   CO2 22 22 - 32 mmol/L   Glucose, Bld 119 (H) 65 - 99 mg/dL   BUN 10 6 - 20 mg/dL   Creatinine, Ser 0.86 0.61 - 1.24 mg/dL   Calcium 9.8 8.9 - 10.3 mg/dL   Total Protein 8.0 6.5 - 8.1 g/dL   Albumin 4.7 3.5 - 5.0 g/dL   AST 26 15 - 41 U/L   ALT 23 17 - 63 U/L   Alkaline Phosphatase 79 38 - 126 U/L   Total Bilirubin 0.8 0.3 - 1.2 mg/dL   GFR calc non Af Amer >60 >60 mL/min   GFR calc Af Amer >60 >60 mL/min   Anion gap 10 5 - 15  CBC     Status: Abnormal   Collection Time: 01/12/16  2:10 PM  Result Value Ref Range   WBC 18.3 (H) 4.0 - 10.5 K/uL   RBC 5.09 4.22 - 5.81 MIL/uL    Hemoglobin 13.8 13.0 - 17.0 g/dL   HCT 38.9 (L) 39.0 - 52.0 %   MCV 76.4 (L) 78.0 - 100.0 fL   MCH 27.1 26.0 - 34.0 pg   MCHC 35.5 30.0 - 36.0 g/dL   RDW 13.8 11.5 - 15.5 %   Platelets 197 150 - 400 K/uL  Troponin I     Status: None   Collection Time: 01/12/16  2:10 PM  Result Value Ref Range   Troponin I <0.03 <0.03 ng/mL  Urinalysis, Routine w reflex microscopic     Status: Abnormal   Collection Time: 01/12/16  4:00 PM  Result Value Ref Range   Color, Urine YELLOW YELLOW   APPearance HAZY (A) CLEAR   Specific Gravity, Urine 1.020 1.005 - 1.030   pH 5.5 5.0 - 8.0   Glucose, UA NEGATIVE NEGATIVE mg/dL   Hgb urine dipstick LARGE (A) NEGATIVE   Bilirubin Urine NEGATIVE NEGATIVE   Ketones, ur TRACE (A) NEGATIVE mg/dL   Protein, ur TRACE (A) NEGATIVE mg/dL   Nitrite NEGATIVE NEGATIVE   Leukocytes, UA LARGE (A) NEGATIVE  Urine microscopic-add on     Status: Abnormal   Collection Time: 01/12/16  4:00 PM  Result Value Ref Range   Squamous Epithelial / LPF 0-5 (A) NONE SEEN   WBC, UA TOO NUMEROUS TO COUNT 0 - 5 WBC/hpf   RBC / HPF 6-30 0 - 5 RBC/hpf   Bacteria, UA MANY (A) NONE SEEN  I-Stat CG4 Lactic Acid, ED     Status: None   Collection Time: 01/12/16  6:26 PM  Result Value Ref Range   Lactic Acid, Venous 1.11 0.5 - 1.9 mmol/L   No results found for this or any previous visit (from the past 240 hour(s)).  Renal Function:  Recent Labs  01/12/16 1410  CREATININE 0.86   Estimated Creatinine Clearance: 106.1 mL/min (by C-G formula based on SCr of 0.86 mg/dL).  Radiologic Imaging: Dg Chest 2 View  Result Date: 01/12/2016 CLINICAL DATA:  Low-grade fever, smoker EXAM: CHEST  2 VIEW COMPARISON:  Radiograph 09/21/2015 FINDINGS: Exam is lordotic. No effusion, infiltrate pneumothorax. Normal cardiac silhouette. Low lung volumes. IMPRESSION: No acute cardiopulmonary process.  Low lung volumes. Electronically Signed   By: Suzy Bouchard M.D.   On: 01/12/2016 15:23   Ct Abdomen  Pelvis W Contrast  Result Date: 01/12/2016 CLINICAL DATA:  Generalized abdomen pain, nausea vomiting since yesterday EXAM: CT ABDOMEN AND PELVIS WITH CONTRAST TECHNIQUE: Multidetector CT imaging of the abdomen  and pelvis was performed using the standard protocol following bolus administration of intravenous contrast. CONTRAST:  143mL ISOVUE-300 IOPAMIDOL (ISOVUE-300) INJECTION 61% COMPARISON:  September 21, 2015 CT abdomen pelvis, MRI of the abdomen September 22, 2015 FINDINGS: Lower chest: There is mild atelectasis of the posterior lung bases. There is no focal pneumonia or pleural effusion. There is a small hiatal hernia. The heart size is normal. Hepatobiliary: The liver and gallbladder are normal. No masses or other significant abnormality. Pancreas: No mass, inflammatory changes, or other significant abnormality. Spleen: Within normal limits in size and appearance. Adrenals/Urinary Tract: There is a 2.3 cm mass in the left adrenal gland unchanged compared to prior MRI of abdomen and prior CT of abdomen of April 2017. The right adrenal gland is normal. There is a 3 cm enhancing mass in the lateral midpole right kidney unchanged compared to prior exams. This was found to be consistent with renal cell carcinoma on prior MRI of abdomen dated April 2017. There are simple cysts within the left kidney. There is right hydroureteronephrosis due to obstruction by a 6 mm stone in the distal right ureter. Nonobstructing stone is identified within the left kidney. Stomach/Bowel: No evidence of obstruction, inflammatory process, or abnormal fluid collections. Vascular/Lymphatic: No pathologically enlarged lymph nodes. No evidence of abdominal aortic aneurysm. Atherosclerosis of the abdominal aorta is identified. Reproductive: No mass or other significant abnormality. Other: None. Musculoskeletal: No suspicious bone lesions identified. Degenerative joint changes of the spine are noted. IMPRESSION: Right hydroureteronephrosis due to  obstruction by 6 mm stone in the distal right ureter likely accounting for the patient's acute symptoms. Solid exophytic lesion in the midpole right kidney suspicious for renal cell carcinoma previously worked up with MRI of the abdomen September 22, 2015. No evidence of renal vein invasion. Indeterminate lesion in the left adrenal gland unchanged compared to prior MRI of the abdomen dated September 22, 2015. Electronically Signed   By: Abelardo Diesel M.D.   On: 01/12/2016 16:37    I independently reviewed the above imaging studies.  Impression/Recommendation 60 yo male with history of prostate cancer s/p prostatectomy, right renal mass and 90mm right distal ureteral stone with secondary right hydroureteronephrosis. His urine is concerning for infection and he does have a leukocytosis but otherwise appears to be hemodynamically stable. Given the concern for UTI we discussed the need to decompress his right kidney. We discussed options such as right ureteral stent placement, right nephrostomy tube placement, and medical management. We recommended right ureteral stent placement and he agreed with this. Risks and benefits of the procedure were discussed.    - Will post for cystoscopy, right retrograde pyelogram, right ureteral stent placement  - Please keep NPO pending OR  - Agree with broad spectrum antibiotics pending return of cultures - We will discuss further management of his right renal mass prior to discharge   Lolita Rieger 01/12/2016, 9:04 PM   Patient was seen, examined,treatment plan was discussed with the resident.  I have directly reviewed the clinical findings, lab, imaging studies and management of this patient in detail. I have made the necessary changes and/or additions to the above noted documentation, and agree with the documentation, as recorded by the resident.  He would like to consider having his right renal mass treated by Dr. Alinda Money.

## 2016-01-12 NOTE — ED Notes (Signed)
OR called for patient, pt ready for transport, valuables given to family, consent signed

## 2016-01-12 NOTE — Anesthesia Procedure Notes (Signed)
Procedure Name: Intubation Date/Time: 01/12/2016 9:59 PM Performed by: Lajuana Carry E Pre-anesthesia Checklist: Patient identified, Emergency Drugs available, Suction available and Patient being monitored Patient Re-evaluated:Patient Re-evaluated prior to inductionOxygen Delivery Method: Circle system utilized Preoxygenation: Pre-oxygenation with 100% oxygen Intubation Type: IV induction, Rapid sequence and Cricoid Pressure applied Laryngoscope Size: Miller and 3 Grade View: Grade I Tube type: Oral Tube size: 7.5 mm Number of attempts: 1 Airway Equipment and Method: Stylet Placement Confirmation: ETT inserted through vocal cords under direct vision,  positive ETCO2 and breath sounds checked- equal and bilateral Secured at: 22 cm Tube secured with: Tape Dental Injury: Teeth and Oropharynx as per pre-operative assessment

## 2016-01-12 NOTE — Transfer of Care (Signed)
Immediate Anesthesia Transfer of Care Note  Patient: Perry [name]  Procedure(s) Performed: Procedure(s): CYSTOSCOPY WITH RETROGRADE PYELOGRAM/URETERAL STENT PLACEMENT (Right)  Patient Location: PACU  Anesthesia Type:General  Level of Consciousness:  sedated, patient cooperative and responds to stimulation  Airway & Oxygen Therapy:Patient Spontanous Breathing and Patient connected to face mask oxgen  Post-op Assessment:  Report given to PACU RN and Post -op Vital signs reviewed and stable  Post vital signs:  Reviewed and stable  Last Vitals:  Vitals:   01/12/16 1845 01/12/16 1900  BP:  145/71  Pulse: 71 85  Resp: 22 18  Temp:  123XX123 C    Complications: No apparent anesthesia complicationsImmediate Anesthesia Transfer of Care Note  Patient: Perry [name]  Procedure(s) Performed: Procedure(s): CYSTOSCOPY WITH RETROGRADE PYELOGRAM/URETERAL STENT PLACEMENT (Right)  Patient Location: PACU  Anesthesia Type:General  Level of Consciousness: awake, oriented and patient cooperative  Airway & Oxygen Therapy: Patient Spontanous Breathing and Patient connected to face mask oxygen  Post-op Assessment: Report given to RN, Post -op Vital signs reviewed and stable and Patient moving all extremities  Post vital signs: Reviewed and stable  Last Vitals:  Vitals:   01/12/16 1845 01/12/16 1900  BP:  145/71  Pulse: 71 85  Resp: 22 18  Temp:  37.3 C    Last Pain:  Vitals:   01/12/16 1900  TempSrc: Oral  PainSc:          Complications: No apparent anesthesia complications

## 2016-01-12 NOTE — Op Note (Signed)
Operative Note:  Preoperative Diagnosis: Right ureteral stones  Postoperative Diagnosis:  Same  Procedure(s) Performed:   1. Cystourethroscopy 2. Right retrograde pyelogram 3. Right diagnostic ureteroscopy 4. Right ureteral stent placement, 29F x 24 cm JJ ureteral stent without string  Teaching Surgeon:  Kathie Rhodes, MD  Resident Surgeon:  Virginia Crews, MD  Assistant(s):  None  Anesthesia:  General via endotracheal tube  Fluids:  See anesthesia record  Estimated blood loss:  <5 mL  Specimens:  None  Drains:  RIGHT 29F x 24 cm JJ ureteral stent without string  Complications:  None  Indications: See prior  Findings:   1. Cystourethroscopy with patent appearing bladder neck s/p prostatectomy and normal anterior urethra with evidence of cystitis in bladder (snow globe appearance) 2. Unable to advance wire up successfully initially due to false passage in the intramural ureter but able to redirect with ureteroscopy 3. Successful placement of right 29F x 24 cm JJ ureteral stent without string  Description:  The patient was correctly identified in the preop holding area where written informed consent as well potential risk and complication reviewed. She agreed. They were brought back to the operative suite where a preinduction timeout was performed. Once correct information was verified, general anesthesia was induced via endotracheal tube. They were then gently placed into dorsal lithotomy position with SCDs in place for VTE prophylaxis. They were prepped and draped in the usual sterile fashion and given appropriate preoperative antibiotics. A second timeout was then performed.   We inserted a 45F rigid cystoscope per urethra with copious lubrication and normal saline irrigation running. This demonstrated findings as described above.  We cannulated the right ureteral orifice with the combination of a open ended catheter and performed a right retrograde pyelogram which demonstrated  moderate hydronephrosis down to the distal ureter. We tried to pass a sensor wire but could not advance the wire past the intramural ureter. We again tried with a glidewire but were unsuccessful. We used a 29Fr rigid ureteroscope and guided this into the distal ureter where we followed the true lumen and advanced a sensor wire into the right renal collecting system. We obtained a 29F x 24 cm JJ ureteral stent without string. This was advanced over the wire into the renal pelvis with mild resistance at the stone under fluoroscopic and direct visual guidance. There was an excellent stent curl in the right renal pelvis and curl in the bladder following wire removal.  All instrumentation was removed and the bladder was emptied. He was awoken and taken to the PACU in stable condition.  Post Op Plan:   1. Admit to hospitalist service. 2. Continue broad spectrum antibiotics pending culture.  Attestation:  Dr. Karsten Ro was present for the entirety of the procedure.    I was present and assisted with the entire case.

## 2016-01-12 NOTE — ED Notes (Signed)
MD at bedside. 

## 2016-01-12 NOTE — ED Provider Notes (Signed)
Patient arrives as a transfer from [facility]. He has a 6 mm ureteral stone with associated UTI. Urology to see patient. They request the hospitalist to admit. I spoke with Dr. Inda Merlin who will admit the patient.   Malvin Johns, MD 01/12/16 2131

## 2016-01-13 ENCOUNTER — Encounter (HOSPITAL_COMMUNITY): Payer: Self-pay | Admitting: Urology

## 2016-01-13 ENCOUNTER — Inpatient Hospital Stay (HOSPITAL_COMMUNITY): Payer: Medicare Other

## 2016-01-13 DIAGNOSIS — N200 Calculus of kidney: Secondary | ICD-10-CM

## 2016-01-13 DIAGNOSIS — N201 Calculus of ureter: Secondary | ICD-10-CM

## 2016-01-13 DIAGNOSIS — N10 Acute tubulo-interstitial nephritis: Secondary | ICD-10-CM

## 2016-01-13 LAB — BASIC METABOLIC PANEL
Anion gap: 6 (ref 5–15)
BUN: 12 mg/dL (ref 6–20)
CHLORIDE: 109 mmol/L (ref 101–111)
CO2: 24 mmol/L (ref 22–32)
CREATININE: 0.92 mg/dL (ref 0.61–1.24)
Calcium: 9.4 mg/dL (ref 8.9–10.3)
GFR calc Af Amer: >60 mL/min (ref >60)
GLUCOSE: 117 mg/dL — AB (ref 65–99)
POTASSIUM: 3.8 mmol/L (ref 3.5–5.1)
Sodium: 139 mmol/L (ref 135–145)

## 2016-01-13 LAB — CBC
HCT: 32.7 % — ABNORMAL LOW (ref 39.0–52.0)
Hemoglobin: 11.8 g/dL — ABNORMAL LOW (ref 13.0–17.0)
MCH: 27.2 pg (ref 26.0–34.0)
MCHC: 36.1 g/dL — AB (ref 30.0–36.0)
MCV: 75.3 fL — AB (ref 78.0–100.0)
PLATELETS: 165 10*3/uL (ref 150–400)
RBC: 4.34 MIL/uL (ref 4.22–5.81)
RDW: 13.7 % (ref 11.5–15.5)
WBC: 15.8 10*3/uL — ABNORMAL HIGH (ref 4.0–10.5)

## 2016-01-13 MED ORDER — PROMETHAZINE HCL 25 MG/ML IJ SOLN
12.5000 mg | Freq: Once | INTRAMUSCULAR | Status: AC
  Administered 2016-01-13: 12.5 mg via INTRAVENOUS
  Filled 2016-01-13: qty 1

## 2016-01-13 MED ORDER — ONDANSETRON HCL 4 MG/2ML IJ SOLN
4.0000 mg | Freq: Four times a day (QID) | INTRAMUSCULAR | Status: DC | PRN
  Administered 2016-01-13 – 2016-01-14 (×4): 4 mg via INTRAVENOUS
  Filled 2016-01-13 (×4): qty 2

## 2016-01-13 MED ORDER — PROMETHAZINE HCL 25 MG/ML IJ SOLN
12.5000 mg | INTRAMUSCULAR | Status: DC | PRN
  Administered 2016-01-13 – 2016-01-14 (×3): 12.5 mg via INTRAVENOUS
  Filled 2016-01-13 (×5): qty 1

## 2016-01-13 MED ORDER — ONDANSETRON HCL 4 MG/2ML IJ SOLN: 4.0000 mg | Freq: Four times a day (QID) | INTRAMUSCULAR | Status: DC | PRN

## 2016-01-13 MED ORDER — MORPHINE SULFATE (PF) 2 MG/ML IV SOLN
2.0000 mg | INTRAVENOUS | Status: DC | PRN
  Administered 2016-01-13 – 2016-01-14 (×2): 2 mg via INTRAVENOUS
  Filled 2016-01-13 (×2): qty 1

## 2016-01-13 MED ORDER — CETYLPYRIDINIUM CHLORIDE 0.05 % MT LIQD
7.0000 mL | Freq: Two times a day (BID) | OROMUCOSAL | Status: DC
  Administered 2016-01-14 – 2016-01-15 (×3): 7 mL via OROMUCOSAL

## 2016-01-13 MED ORDER — LACTATED RINGERS IV SOLN: INTRAVENOUS | Status: DC

## 2016-01-13 MED ORDER — CHLORHEXIDINE GLUCONATE 0.12 % MT SOLN
15.0000 mL | Freq: Two times a day (BID) | OROMUCOSAL | Status: DC
  Administered 2016-01-13 – 2016-01-15 (×5): 15 mL via OROMUCOSAL
  Filled 2016-01-13 (×7): qty 15

## 2016-01-13 NOTE — Progress Notes (Signed)
1 Day Post-Op Subjective: The patient is doing well, feels better than prior to stent.  Does endorse some nausea this AM after trying to eat apple sauce. Pain is adequately controlled. Voiding well. WBC down to 15 from 18.  Objective: Vital signs in last 24 hours: Temp:  [98.5 F (36.9 C)-99.6 F (37.6 C)] 98.9 F (37.2 C) (08/04 0556) Pulse Rate:  [58-106] 58 (08/04 0556) Resp:  [18-29] 18 (08/04 0556) BP: (123-195)/(69-109) 125/69 (08/04 0556) SpO2:  [92 %-100 %] 98 % (08/04 0556) Weight:  [87.1 kg (192 lb 1.6 oz)-99.3 kg (219 lb)] 87.1 kg (192 lb 1.6 oz) (08/03 2330)  Intake/Output from previous day: 08/03 0701 - 08/04 0700 In: 1200 [I.V.:1200] Out: 775 [Urine:775] Intake/Output this shift: No intake/output data recorded.  Physical Exam:  General: Alert and oriented. CV: RRR Lungs: Clear bilaterally. GI: Soft, Nondistended. Mild right CVA tenderness, improved Extremities: Nontender, no erythema, no edema.  Lab Results:  Recent Labs  01/12/16 1410 01/13/16 0530  HGB 13.8 11.8*  HCT 38.9* 32.7*          Recent Labs  01/12/16 1410 01/13/16 0530  CREATININE 0.86 0.92           Results for orders placed or performed during the hospital encounter of 01/12/16 (from the past 24 hour(s))  Lipase, blood     Status: None   Collection Time: 01/12/16  2:10 PM  Result Value Ref Range   Lipase 13 11 - 51 U/L  Comprehensive metabolic panel     Status: Abnormal   Collection Time: 01/12/16  2:10 PM  Result Value Ref Range   Sodium 136 135 - 145 mmol/L   Potassium 3.3 (L) 3.5 - 5.1 mmol/L   Chloride 104 101 - 111 mmol/L   CO2 22 22 - 32 mmol/L   Glucose, Bld 119 (H) 65 - 99 mg/dL   BUN 10 6 - 20 mg/dL   Creatinine, Ser 0.86 0.61 - 1.24 mg/dL   Calcium 9.8 8.9 - 10.3 mg/dL   Total Protein 8.0 6.5 - 8.1 g/dL   Albumin 4.7 3.5 - 5.0 g/dL   AST 26 15 - 41 U/L   ALT 23 17 - 63 U/L   Alkaline Phosphatase 79 38 - 126 U/L   Total Bilirubin 0.8 0.3 - 1.2 mg/dL   GFR calc  non Af Amer >60 >60 mL/min   GFR calc Af Amer >60 >60 mL/min   Anion gap 10 5 - 15  CBC     Status: Abnormal   Collection Time: 01/12/16  2:10 PM  Result Value Ref Range   WBC 18.3 (H) 4.0 - 10.5 K/uL   RBC 5.09 4.22 - 5.81 MIL/uL   Hemoglobin 13.8 13.0 - 17.0 g/dL   HCT 38.9 (L) 39.0 - 52.0 %   MCV 76.4 (L) 78.0 - 100.0 fL   MCH 27.1 26.0 - 34.0 pg   MCHC 35.5 30.0 - 36.0 g/dL   RDW 13.8 11.5 - 15.5 %   Platelets 197 150 - 400 K/uL  Troponin I     Status: None   Collection Time: 01/12/16  2:10 PM  Result Value Ref Range   Troponin I <0.03 <0.03 ng/mL  Urinalysis, Routine w reflex microscopic     Status: Abnormal   Collection Time: 01/12/16  4:00 PM  Result Value Ref Range   Color, Urine YELLOW YELLOW   APPearance HAZY (A) CLEAR   Specific Gravity, Urine 1.020 1.005 - 1.030   pH 5.5  5.0 - 8.0   Glucose, UA NEGATIVE NEGATIVE mg/dL   Hgb urine dipstick LARGE (A) NEGATIVE   Bilirubin Urine NEGATIVE NEGATIVE   Ketones, ur TRACE (A) NEGATIVE mg/dL   Protein, ur TRACE (A) NEGATIVE mg/dL   Nitrite NEGATIVE NEGATIVE   Leukocytes, UA LARGE (A) NEGATIVE  Urine microscopic-add on     Status: Abnormal   Collection Time: 01/12/16  4:00 PM  Result Value Ref Range   Squamous Epithelial / LPF 0-5 (A) NONE SEEN   WBC, UA TOO NUMEROUS TO COUNT 0 - 5 WBC/hpf   RBC / HPF 6-30 0 - 5 RBC/hpf   Bacteria, UA MANY (A) NONE SEEN  I-Stat CG4 Lactic Acid, ED     Status: None   Collection Time: 01/12/16  6:26 PM  Result Value Ref Range   Lactic Acid, Venous 1.11 0.5 - 1.9 mmol/L  CBC     Status: Abnormal   Collection Time: 01/13/16  5:30 AM  Result Value Ref Range   WBC 15.8 (H) 4.0 - 10.5 K/uL   RBC 4.34 4.22 - 5.81 MIL/uL   Hemoglobin 11.8 (L) 13.0 - 17.0 g/dL   HCT 32.7 (L) 39.0 - 52.0 %   MCV 75.3 (L) 78.0 - 100.0 fL   MCH 27.2 26.0 - 34.0 pg   MCHC 36.1 (H) 30.0 - 36.0 g/dL   RDW 13.7 11.5 - 15.5 %   Platelets 165 150 - 400 K/uL  Basic metabolic panel     Status: Abnormal    Collection Time: 01/13/16  5:30 AM  Result Value Ref Range   Sodium 139 135 - 145 mmol/L   Potassium 3.8 3.5 - 5.1 mmol/L   Chloride 109 101 - 111 mmol/L   CO2 24 22 - 32 mmol/L   Glucose, Bld 117 (H) 65 - 99 mg/dL   BUN 12 6 - 20 mg/dL   Creatinine, Ser 0.92 0.61 - 1.24 mg/dL   Calcium 9.4 8.9 - 10.3 mg/dL   GFR calc non Af Amer >60 >60 mL/min   GFR calc Af Amer >60 >60 mL/min   Anion gap 6 5 - 15    Assessment/Plan: POD# 1 s/p right ureteral stent placement for 60mm right ureteral stone with UTI  - Agree with broad spectrum antibiotics pending return of culture. Pending culture results, can transition to oral antibiotics - We will arrange outpatient follow up to discuss further options for treatment of the stone in 1-2 weeks   LOS: 1 day   Lolita Rieger 01/13/2016, 7:07 AM     Patient was seen, examined,treatment plan was discussed with the resident.  I have directly reviewed the clinical findings, lab, imaging studies and management of this patient in detail. I have made the necessary changes and/or additions to the above noted documentation, and agree with the documentation, as recorded by the resident.

## 2016-01-13 NOTE — Progress Notes (Signed)
PROGRESS NOTE    Perry [name]  [id number] DOB: [birth year] DOA: 01/12/2016 PCP: Gerome Sam, MD   Brief Narrative:  Perry [name] is a 60 year old male with a history of prostate cancer, presenting to the emergency room at [facility] on 01/12/2016 with complaints of abdominal pain associated with nausea and vomiting. Workup revealed a right hydronephrosis associate with a 6 mm stone in the distal right ureter. He was transferred to [facility] where he was evaluated by urology. Overnight he underwent cystourethroscopy with placement of right ureteral stent, procedure performed by Dr. Karsten Ro of urology.   Assessment & Plan:   Principal Problem:   Right ureteral stone Active Problems:   Renal mass   Hyperlipidemia   Hypokalemia   Leukocytosis   UTI (urinary tract infection)   1.  Obstructive uropathy -Perry [name] is a 60 year old gentleman presenting with nausea vomiting abdominal pain, with imaging studies performed at [facility] showing right sided hydronephrosis associated with 6 mm obstructing stone. -Urology was consulted as he underwent cystourethroscopy with placement of stent. -Plan to continue antibiotic therapy, urology following  2.  Urinary tract infection -Secondary to obstructive uropathy, urinalysis showed the presence of many bacteria and white blood cells -Lab showing downward trend in white count from 18,300-15,800 -Will follow-up on urine cultures meanwhile continue ceftriaxone 1 g IV every 24 hours  3.  History of right renal mass -CT scan showing solid exophytic lesion in the midpole of right kidney -Currently being followed at the [facility] -Urology also following  4.  Hypokalemia -Potassium normalized at 3.8 after receiving replacement   DVT prophylaxis: SCDs Code Status: FULL code Family Communication: I spoke to family members present at bedside Disposition Plan:   Consultants:   Urology  Procedures:  1.  Cystourethroscopy 2. Right retrograde pyelogram 3. Right diagnostic ureteroscopy 4. Right ureteral stent placement, 26F x 24 cm JJ ureteral stent without string  Antimicrobials:   Ceftriaxone    Subjective: Patient reporting several episodes of nausea and vomiting this morning  Objective: Vitals:   01/13/16 0556 01/13/16 1356 01/13/16 1422 01/13/16 1516  BP: 125/69 (!) 172/102 (!) 175/100 (!) 158/89  Pulse: (!) 58 76 80 78  Resp: 18 18    Temp: 98.9 F (37.2 C) 99.1 F (37.3 C) 98.8 F (37.1 C)   TempSrc:  Oral Oral   SpO2: 98%     Weight:      Height:        Intake/Output Summary (Last 24 hours) at 01/13/16 1517 Last data filed at 01/13/16 1423  Gross per 24 hour  Intake             1200 ml  Output             2100 ml  Net             -900 ml   Filed Weights   01/12/16 1339 01/12/16 2330  Weight: 99.3 kg (219 lb) 87.1 kg (192 lb 1.6 oz)    Examination:  General exam: He is awake alert, nontoxic appearing, reporting nausea Respiratory system: Clear to auscultation. Respiratory effort normal. Cardiovascular system: S1 & S2 heard, RRR. No JVD, murmurs, rubs, gallops or clicks. No pedal edema. Gastrointestinal system: Abdomen is nondistended, soft and nontender. No organomegaly or masses felt. Normal bowel sounds heard. Central nervous system: Alert and oriented. No focal neurological deficits. Extremities: Symmetric 5 x 5 power. Skin: No rashes, lesions or ulcers Psychiatry: Judgement and insight appear normal. Mood &  affect appropriate.     Data Reviewed: I have personally reviewed following labs and imaging studies  CBC:  Recent Labs Lab 01/12/16 1410 01/13/16 0530  WBC 18.3* 15.8*  HGB 13.8 11.8*  HCT 38.9* 32.7*  MCV 76.4* 75.3*  PLT 197 [id number]   Basic Metabolic Panel:  Recent Labs Lab 01/12/16 1410 01/13/16 0530  NA 136 139  K 3.3* 3.8  CL 104 109  CO2 22 24  GLUCOSE 119* 117*  BUN 10 12  CREATININE 0.86 0.92  CALCIUM 9.8 9.4    GFR: Estimated Creatinine Clearance: 93.4 mL/min (by C-G formula based on SCr of 0.92 mg/dL). Liver Function Tests:  Recent Labs Lab 01/12/16 1410  AST 26  ALT 23  ALKPHOS 79  BILITOT 0.8  PROT 8.0  ALBUMIN 4.7    Recent Labs Lab 01/12/16 1410  LIPASE 13   No results for input(s): AMMONIA in the last 168 hours. Coagulation Profile: No results for input(s): INR, PROTIME in the last 168 hours. Cardiac Enzymes:  Recent Labs Lab 01/12/16 1410  TROPONINI <0.03   BNP (last 3 results) No results for input(s): PROBNP in the last 8760 hours. HbA1C: No results for input(s): HGBA1C in the last 72 hours. CBG: No results for input(s): GLUCAP in the last 168 hours. Lipid Profile: No results for input(s): CHOL, HDL, LDLCALC, TRIG, CHOLHDL, LDLDIRECT in the last 72 hours. Thyroid Function Tests: No results for input(s): TSH, T4TOTAL, FREET4, T3FREE, THYROIDAB in the last 72 hours. Anemia Panel: No results for input(s): VITAMINB12, FOLATE, FERRITIN, TIBC, IRON, RETICCTPCT in the last 72 hours. Sepsis Labs:  Recent Labs Lab 01/12/16 1826  LATICACIDVEN 1.11    No results found for this or any previous visit (from the past 240 hour(s)).       Radiology Studies: Dg Chest 2 View  Result Date: 01/12/2016 CLINICAL DATA:  Low-grade fever, smoker EXAM: CHEST  2 VIEW COMPARISON:  Radiograph 09/21/2015 FINDINGS: Exam is lordotic. No effusion, infiltrate pneumothorax. Normal cardiac silhouette. Low lung volumes. IMPRESSION: No acute cardiopulmonary process.  Low lung volumes. Electronically Signed   By: Suzy Bouchard M.D.   On: 01/12/2016 15:23   Ct Abdomen Pelvis W Contrast  Result Date: 01/12/2016 CLINICAL DATA:  Generalized abdomen pain, nausea vomiting since yesterday EXAM: CT ABDOMEN AND PELVIS WITH CONTRAST TECHNIQUE: Multidetector CT imaging of the abdomen and pelvis was performed using the standard protocol following bolus administration of intravenous contrast.  CONTRAST:  115mL ISOVUE-300 IOPAMIDOL (ISOVUE-300) INJECTION 61% COMPARISON:  September 21, 2015 CT abdomen pelvis, MRI of the abdomen September 22, 2015 FINDINGS: Lower chest: There is mild atelectasis of the posterior lung bases. There is no focal pneumonia or pleural effusion. There is a small hiatal hernia. The heart size is normal. Hepatobiliary: The liver and gallbladder are normal. No masses or other significant abnormality. Pancreas: No mass, inflammatory changes, or other significant abnormality. Spleen: Within normal limits in size and appearance. Adrenals/Urinary Tract: There is a 2.3 cm mass in the left adrenal gland unchanged compared to prior MRI of abdomen and prior CT of abdomen of April 2017. The right adrenal gland is normal. There is a 3 cm enhancing mass in the lateral midpole right kidney unchanged compared to prior exams. This was found to be consistent with renal cell carcinoma on prior MRI of abdomen dated April 2017. There are simple cysts within the left kidney. There is right hydroureteronephrosis due to obstruction by a 6 mm stone in the distal right ureter. Nonobstructing stone  is identified within the left kidney. Stomach/Bowel: No evidence of obstruction, inflammatory process, or abnormal fluid collections. Vascular/Lymphatic: No pathologically enlarged lymph nodes. No evidence of abdominal aortic aneurysm. Atherosclerosis of the abdominal aorta is identified. Reproductive: No mass or other significant abnormality. Other: None. Musculoskeletal: No suspicious bone lesions identified. Degenerative joint changes of the spine are noted. IMPRESSION: Right hydroureteronephrosis due to obstruction by 6 mm stone in the distal right ureter likely accounting for the patient's acute symptoms. Solid exophytic lesion in the midpole right kidney suspicious for renal cell carcinoma previously worked up with MRI of the abdomen September 22, 2015. No evidence of renal vein invasion. Indeterminate lesion in the  left adrenal gland unchanged compared to prior MRI of the abdomen dated September 22, 2015. Electronically Signed   By: Abelardo Diesel M.D.   On: 01/12/2016 16:37   Dg C-arm 1-60 Min-no Report  Result Date: 01/12/2016 CLINICAL DATA: surgery C-ARM 1-60 MINUTES Fluoroscopy was utilized by the requesting physician.  No radiographic interpretation.        Scheduled Meds: . antiseptic oral rinse  7 mL Mouth Rinse q12n4p  . cefTRIAXone (ROCEPHIN)  IV  2 g Intravenous Q24H  . chlorhexidine  15 mL Mouth Rinse BID  . docusate sodium  100 mg Oral BID  . enoxaparin (LOVENOX) injection  40 mg Subcutaneous QHS  . oxybutynin  10 mg Oral Daily  . pravastatin  5 mg Oral QHS   Continuous Infusions: . lactated ringers 100 mL/hr at 01/13/16 1423     LOS: 1 day    Time spent: 35 min     Kelvin Cellar, MD [facility] Pager [phone]  If 7PM-7AM, please contact night-coverage [email or web address] Password [email or web address] 01/13/2016, 3:17 PM

## 2016-01-13 NOTE — Anesthesia Postprocedure Evaluation (Signed)
Anesthesia Post Note  Patient: Dago Mom  Procedure(s) Performed: Procedure(s) (LRB): CYSTOSCOPY WITH RETROGRADE PYELOGRAM/URETERAL STENT PLACEMENT (Right)  Patient location during evaluation: PACU Anesthesia Type: General Level of consciousness: oriented, sedated and patient cooperative Pain management: pain level controlled Vital Signs Assessment: post-procedure vital signs reviewed and stable Respiratory status: spontaneous breathing, nonlabored ventilation, respiratory function stable and patient connected to nasal cannula oxygen Cardiovascular status: blood pressure returned to baseline and stable Postop Assessment: no signs of nausea or vomiting Anesthetic complications: no    Last Vitals:  Vitals:   01/12/16 2315 01/12/16 2330  BP: 139/80 135/84  Pulse: 64 66  Resp: (!) 22   Temp: 37.5 C 36.9 C    Last Pain:  Vitals:   01/12/16 2242  TempSrc:   PainSc: Asleep                 Yuvraj Pfeifer,E. Maxi Rodas

## 2016-01-13 NOTE — Progress Notes (Signed)
Pt. C/O nausea. MD made aware. Will continue to monitor pt. Closely and will carry out any new orders

## 2016-01-13 NOTE — Discharge Instructions (Signed)

## 2016-01-13 NOTE — Progress Notes (Signed)
Pt. BP 179/104 HR 80. NP made aware. Will continue to monitor pt. Closely and carry out any new orders.

## 2016-01-14 DIAGNOSIS — N2889 Other specified disorders of kidney and ureter: Secondary | ICD-10-CM

## 2016-01-14 DIAGNOSIS — N3000 Acute cystitis without hematuria: Secondary | ICD-10-CM

## 2016-01-14 LAB — CBC
HEMATOCRIT: 37.8 % — AB (ref 39.0–52.0)
HEMOGLOBIN: 13.3 g/dL (ref 13.0–17.0)
MCH: 26.3 pg (ref 26.0–34.0)
MCHC: 35.2 g/dL (ref 30.0–36.0)
MCV: 74.9 fL — AB (ref 78.0–100.0)
Platelets: 181 10*3/uL (ref 150–400)
RBC: 5.05 MIL/uL (ref 4.22–5.81)
RDW: 13.7 % (ref 11.5–15.5)
WBC: 12.7 10*3/uL — ABNORMAL HIGH (ref 4.0–10.5)

## 2016-01-14 LAB — BASIC METABOLIC PANEL
ANION GAP: 9 (ref 5–15)
BUN: 11 mg/dL (ref 6–20)
CHLORIDE: 106 mmol/L (ref 101–111)
CO2: 25 mmol/L (ref 22–32)
CREATININE: 0.95 mg/dL (ref 0.61–1.24)
Calcium: 9.7 mg/dL (ref 8.9–10.3)
GFR calc non Af Amer: >60 mL/min (ref >60)
Glucose, Bld: 111 mg/dL — ABNORMAL HIGH (ref 65–99)
POTASSIUM: 3.5 mmol/L (ref 3.5–5.1)
Sodium: 140 mmol/L (ref 135–145)

## 2016-01-14 MED ORDER — SODIUM CHLORIDE 0.9 % IV SOLN
INTRAVENOUS | Status: DC
  Administered 2016-01-14: 16:00:00 via INTRAVENOUS

## 2016-01-14 MED ORDER — CALCIUM CARBONATE ANTACID 500 MG PO CHEW
1.0000 | CHEWABLE_TABLET | Freq: Three times a day (TID) | ORAL | Status: DC
  Administered 2016-01-14 – 2016-01-16 (×4): 200 mg via ORAL
  Filled 2016-01-14 (×4): qty 1

## 2016-01-14 MED ORDER — HYDRALAZINE HCL 20 MG/ML IJ SOLN
10.0000 mg | Freq: Four times a day (QID) | INTRAMUSCULAR | Status: DC | PRN
  Administered 2016-01-14 – 2016-01-15 (×2): 10 mg via INTRAVENOUS
  Filled 2016-01-14 (×2): qty 1

## 2016-01-14 MED ORDER — HYDRALAZINE HCL 20 MG/ML IJ SOLN
10.0000 mg | Freq: Once | INTRAMUSCULAR | Status: AC
  Administered 2016-01-14: 10 mg via INTRAVENOUS
  Filled 2016-01-14: qty 1

## 2016-01-14 NOTE — Progress Notes (Signed)
Pt. BP 185/102 with HR of 80. MD made aware. No orders were given last time page was sent. Will continue to monitor pt. Closely and will carry out any new orders.

## 2016-01-14 NOTE — Progress Notes (Signed)
PROGRESS NOTE    Perry [name]  [id number] DOB: 08-14-1955 DOA: 01/12/2016 PCP: Gerome Sam, MD   Brief Narrative:  Perry [name] is a 60 year old male with a history of prostate cancer, presenting to the emergency room at [facility] on 01/12/2016 with complaints of abdominal pain associated with nausea and vomiting. Workup revealed a right hydronephrosis associate with a 6 mm stone in the distal right ureter. He was transferred to [facility] where he was evaluated by urology. Overnight he underwent cystourethroscopy with placement of right ureteral stent, procedure performed by Dr. Karsten Ro of urology.   Assessment & Plan:   Principal Problem:   Right ureteral stone Active Problems:   Renal mass   Hyperlipidemia   Hypokalemia   Leukocytosis   UTI (urinary tract infection)   1.  Obstructive uropathy -Perry [name] is a 60 year old gentleman presenting with nausea vomiting abdominal pain, with imaging studies performed at [facility] showing right sided hydronephrosis associated with 6 mm obstructing stone. -Urology was consulted as he underwent cystourethroscopy with placement of stent. -Plan to continue antibiotic therapy, urology following  2.  Nausea/vomiting -Perry [name] continues to have intractable nausea vomiting requiring IV antiemetic therapy -He was worked up with a KUB on 01/13/2016 that did not show evidence of ileus or small bowel obstruction -I think if he continues to have nausea vomiting will further workup with CT scan of abdomen and pelvis as he has a complains of associated abdominal pain.  3.  Urinary tract infection -Secondary to obstructive uropathy, urinalysis showed the presence of many bacteria and white blood cells -Lab showing downward trend in white count from 18,300-15,800 -Urine cultures growing greater than 100,000 colony-forming units of Escherichia coli, susceptibility testing is pending at the time of this  dictation, meanwhile continue ceftriaxone 1 g IV every 24 hours  4.  History of right renal mass -CT scan showing solid exophytic lesion in the midpole of right kidney -Currently being followed at the [facility] -Urology also following  5.  Hypokalemia -Potassium normalized at 3.8 after receiving replacement   DVT prophylaxis: SCDs Code Status: FULL code Family Communication: I spoke to family members present at bedside Disposition Plan:   Consultants:   Urology  Procedures:  1. Cystourethroscopy 2. Right retrograde pyelogram 3. Right diagnostic ureteroscopy 4. Right ureteral stent placement, 58F x 24 cm JJ ureteral stent without string  Antimicrobials:   Ceftriaxone    Subjective: Patient reporting several episodes of nausea and vomiting this morning  Objective: Vitals:   01/14/16 0428 01/14/16 1023 01/14/16 1212 01/14/16 1255  BP: (!) 185/102 (!) 183/100 (!) 181/95 (!) 150/63  Pulse: 80 67 (!) 45 76  Resp: 19     Temp: 98.8 F (37.1 C) 98.6 F (37 C)    TempSrc: Oral Oral    SpO2: 92% 95%    Weight:      Height:        Intake/Output Summary (Last 24 hours) at 01/14/16 1443 Last data filed at 01/14/16 1255  Gross per 24 hour  Intake          2348.33 ml  Output             3260 ml  Net          -911.67 ml   Filed Weights   01/12/16 1339 01/12/16 2330  Weight: 99.3 kg (219 lb) 87.1 kg (192 lb 1.6 oz)    Examination:  General exam: Continues to look ill, having episode of  nausea during my encounter Respiratory system: Clear to auscultation. Respiratory effort normal. Cardiovascular system: S1 & S2 heard, RRR. No JVD, murmurs, rubs, gallops or clicks. No pedal edema. Gastrointestinal system: There is mild generalized tenderness to palpation over her abdomen Central nervous system: Alert and oriented. No focal neurological deficits. Extremities: Symmetric 5 x 5 power. Skin: No rashes, lesions or ulcers Psychiatry: Judgement and insight appear normal. Mood &  affect appropriate.     Data Reviewed: I have personally reviewed following labs and imaging studies  CBC:  Recent Labs Lab 01/12/16 1410 01/13/16 0530 01/14/16 0529  WBC 18.3* 15.8* 12.7*  HGB 13.8 11.8* 13.3  HCT 38.9* 32.7* 37.8*  MCV 76.4* 75.3* 74.9*  PLT 197 165 0000000   Basic Metabolic Panel:  Recent Labs Lab 01/12/16 1410 01/13/16 0530 01/14/16 0529  NA 136 139 140  K 3.3* 3.8 3.5  CL 104 109 106  CO2 22 24 25   GLUCOSE 119* 117* 111*  BUN 10 12 11   CREATININE 0.86 0.92 0.95  CALCIUM 9.8 9.4 9.7   GFR: Estimated Creatinine Clearance: 90.4 mL/min (by C-G formula based on SCr of 0.95 mg/dL). Liver Function Tests:  Recent Labs Lab 01/12/16 1410  AST 26  ALT 23  ALKPHOS 79  BILITOT 0.8  PROT 8.0  ALBUMIN 4.7    Recent Labs Lab 01/12/16 1410  LIPASE 13   No results for input(s): AMMONIA in the last 168 hours. Coagulation Profile: No results for input(s): INR, PROTIME in the last 168 hours. Cardiac Enzymes:  Recent Labs Lab 01/12/16 1410  TROPONINI <0.03   BNP (last 3 results) No results for input(s): PROBNP in the last 8760 hours. HbA1C: No results for input(s): HGBA1C in the last 72 hours. CBG: No results for input(s): GLUCAP in the last 168 hours. Lipid Profile: No results for input(s): CHOL, HDL, LDLCALC, TRIG, CHOLHDL, LDLDIRECT in the last 72 hours. Thyroid Function Tests: No results for input(s): TSH, T4TOTAL, FREET4, T3FREE, THYROIDAB in the last 72 hours. Anemia Panel: No results for input(s): VITAMINB12, FOLATE, FERRITIN, TIBC, IRON, RETICCTPCT in the last 72 hours. Sepsis Labs:  Recent Labs Lab 01/12/16 1826  LATICACIDVEN 1.11    Recent Results (from the past 240 hour(s))  Urine culture     Status: Abnormal (Preliminary result)   Collection Time: 01/12/16  4:00 PM  Result Value Ref Range Status   Specimen Description URINE, CLEAN CATCH  Final   Special Requests NONE  Final   Culture >=100,000 COLONIES/mL ESCHERICHIA  COLI (A)  Final   Report Status PENDING  Incomplete         Radiology Studies: Dg Chest 2 View  Result Date: 01/12/2016 CLINICAL DATA:  Low-grade fever, smoker EXAM: CHEST  2 VIEW COMPARISON:  Radiograph 09/21/2015 FINDINGS: Exam is lordotic. No effusion, infiltrate pneumothorax. Normal cardiac silhouette. Low lung volumes. IMPRESSION: No acute cardiopulmonary process.  Low lung volumes. Electronically Signed   By: Suzy Bouchard M.D.   On: 01/12/2016 15:23   Dg Abd 1 View  Result Date: 01/13/2016 CLINICAL DATA:  Patient with nausea and vomiting. Patient status post placement of right ureteral stent. EXAM: ABDOMEN - 1 VIEW COMPARISON:  CT 01/12/2016. FINDINGS: Right double-J nephro ureteral stent is visualized and appears in appropriate position. Oral contrast material within the bowel. Pelvic phleboliths. Lumbar spine degenerative changes. IMPRESSION: Right double-J nephro ureteral stent appears in appropriate position. Nonobstructed bowel gas pattern. Electronically Signed   By: Lovey Newcomer M.D.   On: 01/13/2016 18:49  Ct Abdomen Pelvis W Contrast  Result Date: 01/12/2016 CLINICAL DATA:  Generalized abdomen pain, nausea vomiting since yesterday EXAM: CT ABDOMEN AND PELVIS WITH CONTRAST TECHNIQUE: Multidetector CT imaging of the abdomen and pelvis was performed using the standard protocol following bolus administration of intravenous contrast. CONTRAST:  124mL ISOVUE-300 IOPAMIDOL (ISOVUE-300) INJECTION 61% COMPARISON:  September 21, 2015 CT abdomen pelvis, MRI of the abdomen September 22, 2015 FINDINGS: Lower chest: There is mild atelectasis of the posterior lung bases. There is no focal pneumonia or pleural effusion. There is a small hiatal hernia. The heart size is normal. Hepatobiliary: The liver and gallbladder are normal. No masses or other significant abnormality. Pancreas: No mass, inflammatory changes, or other significant abnormality. Spleen: Within normal limits in size and appearance.  Adrenals/Urinary Tract: There is a 2.3 cm mass in the left adrenal gland unchanged compared to prior MRI of abdomen and prior CT of abdomen of April 2017. The right adrenal gland is normal. There is a 3 cm enhancing mass in the lateral midpole right kidney unchanged compared to prior exams. This was found to be consistent with renal cell carcinoma on prior MRI of abdomen dated April 2017. There are simple cysts within the left kidney. There is right hydroureteronephrosis due to obstruction by a 6 mm stone in the distal right ureter. Nonobstructing stone is identified within the left kidney. Stomach/Bowel: No evidence of obstruction, inflammatory process, or abnormal fluid collections. Vascular/Lymphatic: No pathologically enlarged lymph nodes. No evidence of abdominal aortic aneurysm. Atherosclerosis of the abdominal aorta is identified. Reproductive: No mass or other significant abnormality. Other: None. Musculoskeletal: No suspicious bone lesions identified. Degenerative joint changes of the spine are noted. IMPRESSION: Right hydroureteronephrosis due to obstruction by 6 mm stone in the distal right ureter likely accounting for the patient's acute symptoms. Solid exophytic lesion in the midpole right kidney suspicious for renal cell carcinoma previously worked up with MRI of the abdomen September 22, 2015. No evidence of renal vein invasion. Indeterminate lesion in the left adrenal gland unchanged compared to prior MRI of the abdomen dated September 22, 2015. Electronically Signed   By: Abelardo Diesel M.D.   On: 01/12/2016 16:37   Dg C-arm 1-60 Min-no Report  Result Date: 01/12/2016 CLINICAL DATA: surgery C-ARM 1-60 MINUTES Fluoroscopy was utilized by the requesting physician.  No radiographic interpretation.        Scheduled Meds: . antiseptic oral rinse  7 mL Mouth Rinse q12n4p  . cefTRIAXone (ROCEPHIN)  IV  2 g Intravenous Q24H  . chlorhexidine  15 mL Mouth Rinse BID  . docusate sodium  100 mg Oral BID  .  enoxaparin (LOVENOX) injection  40 mg Subcutaneous QHS  . oxybutynin  10 mg Oral Daily  . pravastatin  5 mg Oral QHS   Continuous Infusions: . sodium chloride       LOS: 2 days    Time spent: 35 min     Kelvin Cellar, MD [facility] Pager [phone]  If 7PM-7AM, please contact night-coverage [email or web address] Password [email or web address] 01/14/2016, 2:43 PM

## 2016-01-15 DIAGNOSIS — E876 Hypokalemia: Secondary | ICD-10-CM

## 2016-01-15 LAB — BASIC METABOLIC PANEL
Anion gap: 9 (ref 5–15)
BUN: 10 mg/dL (ref 6–20)
CHLORIDE: 106 mmol/L (ref 101–111)
CO2: 24 mmol/L (ref 22–32)
CREATININE: 0.85 mg/dL (ref 0.61–1.24)
Calcium: 9.9 mg/dL (ref 8.9–10.3)
Glucose, Bld: 122 mg/dL — ABNORMAL HIGH (ref 65–99)
Potassium: 3.3 mmol/L — ABNORMAL LOW (ref 3.5–5.1)
SODIUM: 139 mmol/L (ref 135–145)

## 2016-01-15 LAB — CBC
HEMATOCRIT: 39 % (ref 39.0–52.0)
Hemoglobin: 13.9 g/dL (ref 13.0–17.0)
MCH: 26.6 pg (ref 26.0–34.0)
MCHC: 35.6 g/dL (ref 30.0–36.0)
MCV: 74.7 fL — AB (ref 78.0–100.0)
Platelets: 185 10*3/uL (ref 150–400)
RBC: 5.22 MIL/uL (ref 4.22–5.81)
RDW: 13.5 % (ref 11.5–15.5)
WBC: 8.4 10*3/uL (ref 4.0–10.5)

## 2016-01-15 LAB — URINE CULTURE

## 2016-01-15 MED ORDER — AMLODIPINE BESYLATE 5 MG PO TABS
5.0000 mg | ORAL_TABLET | Freq: Every day | ORAL | Status: DC
  Administered 2016-01-15: 5 mg via ORAL
  Filled 2016-01-15: qty 1

## 2016-01-15 MED ORDER — POTASSIUM CHLORIDE CRYS ER 20 MEQ PO TBCR
40.0000 meq | EXTENDED_RELEASE_TABLET | Freq: Once | ORAL | Status: AC
  Administered 2016-01-15: 40 meq via ORAL
  Filled 2016-01-15: qty 2

## 2016-01-15 MED ORDER — ISOSORBIDE MONONITRATE ER 30 MG PO TB24
15.0000 mg | ORAL_TABLET | Freq: Every day | ORAL | Status: DC
  Administered 2016-01-15: 15 mg via ORAL
  Filled 2016-01-15 (×2): qty 1

## 2016-01-15 MED ORDER — CIPROFLOXACIN HCL 500 MG PO TABS
500.0000 mg | ORAL_TABLET | Freq: Two times a day (BID) | ORAL | Status: DC
Start: 2016-01-15 — End: 2016-01-16
  Administered 2016-01-15 – 2016-01-16 (×2): 500 mg via ORAL
  Filled 2016-01-15 (×2): qty 1

## 2016-01-15 MED ORDER — POTASSIUM CHLORIDE CRYS ER 20 MEQ PO TBCR: 40.0000 meq | EXTENDED_RELEASE_TABLET | Freq: Four times a day (QID) | ORAL | Status: DC

## 2016-01-15 MED ORDER — ISOSORBIDE MONONITRATE ER 30 MG PO TB24: 30.0000 mg | ORAL_TABLET | Freq: Every day | ORAL | Status: DC

## 2016-01-15 MED ORDER — AMLODIPINE BESYLATE 10 MG PO TABS
10.0000 mg | ORAL_TABLET | Freq: Every day | ORAL | Status: DC
  Filled 2016-01-15: qty 1

## 2016-01-15 NOTE — Progress Notes (Signed)
PROGRESS NOTE    Perry [name]  [id number] DOB: 29-Aug-1955 DOA: 01/12/2016 PCP: Gerome Sam, MD   Brief Narrative:  Perry [name] is a 60 year old male with a history of prostate cancer, presenting to the emergency room at [facility] on 01/12/2016 with complaints of abdominal pain associated with nausea and vomiting. Workup revealed a right hydronephrosis associate with a 6 mm stone in the distal right ureter. Perry [name] was transferred to [facility] where Perry [name] was evaluated by urology. Overnight Perry [name] underwent cystourethroscopy with placement of right ureteral stent, procedure performed by Dr. Karsten Ro of urology.   Assessment & Plan:   Principal Problem:   Right ureteral stone Active Problems:   Renal mass   Hyperlipidemia   Hypokalemia   Leukocytosis   UTI (urinary tract infection)   1.  Obstructive uropathy -Perry [name] is a 60 year old gentleman presenting with nausea vomiting abdominal pain, with imaging studies performed at [facility] showing right sided hydronephrosis associated with 6 mm obstructing stone. -Urology was consulted as Perry [name] underwent cystourethroscopy with placement of stent. -Will follow urology in the outpatient setting  2.  Nausea/vomiting -Perry [name] continues to have intractable nausea vomiting requiring IV antiemetic therapy -Perry [name] was worked up with a KUB on 01/13/2016 that did not show evidence of ileus or small bowel obstruction -On 01/15/2016 Perry [name] started showing significant clinical improvement, did not require IV antiemetic therapy as his diet was advanced  3.  Urinary tract infection -Secondary to obstructive uropathy, urinalysis showed the presence of many bacteria and white blood cells -Lab showing downward trend in white count from 18,300-15,800 -Urine cultures growing greater than 100,000 colony-forming units of Escherichia coli, organism which is susceptible. -On 01/15/2016 his IV ceftriaxone was stopped as Perry [name] was started  on ciprofloxacin 500 mg by mouth twice a day  4.  History of right renal mass -CT scan showing solid exophytic lesion in the midpole of right kidney -Currently being followed at the [facility] -Urology also following  5.  Hypokalemia -Potassium 3.3, will replace with oral potassium   DVT prophylaxis: SCDs Code Status: FULL code Family Communication: I spoke to family members present at bedside Disposition Plan: Anticipate discharge in the next 24 hours if Perry [name] continues to show improvement and tolerates advancement in his diet  Consultants:   Urology  Procedures:  1. Cystourethroscopy 2. Right retrograde pyelogram 3. Right diagnostic ureteroscopy 4. Right ureteral stent placement, 29F x 24 cm JJ ureteral stent without string  Antimicrobials:   Ceftriaxone    Subjective: Perry [name] states feeling much better today and wants to eat  Objective: Vitals:   01/14/16 1745 01/14/16 2041 01/14/16 2300 01/15/16 0605  BP: 109/82 (!) 159/69  (!) 156/83  Pulse: (!) 107 66  65  Resp:  20  20  Temp: 98.7 F (37.1 C) (!) 100.4 F (38 C) 98.8 F (37.1 C) 99.8 F (37.7 C)  TempSrc: Oral Oral  Oral  SpO2: 100% 99%  97%  Weight:    84.5 kg (186 lb 3.2 oz)  Height:        Intake/Output Summary (Last 24 hours) at 01/15/16 1250 Last data filed at 01/15/16 1115  Gross per 24 hour  Intake          4417.75 ml  Output             3275 ml  Net          1142.75 ml   Filed Weights   01/12/16 1339 01/12/16 2330 01/15/16 [id number]  Weight: 99.3 kg (219 lb) 87.1 kg (192 lb 1.6 oz) 84.5 kg (186 lb 3.2 oz)    Examination:  General exam: Perry [name] looks much better today, awake and alert, interactive, NAD Respiratory system: Clear to auscultation. Respiratory effort normal. Cardiovascular system: S1 & S2 heard, RRR. No JVD, murmurs, rubs, gallops or clicks. No pedal edema. Gastrointestinal system: There is mild generalized tenderness to palpation over her abdomen Central nervous system: Alert and oriented. No  focal neurological deficits. Extremities: Symmetric 5 x 5 power. Skin: No rashes, lesions or ulcers Psychiatry: Judgement and insight appear normal. Mood & affect appropriate.     Data Reviewed: I have personally reviewed following labs and imaging studies  CBC:  Recent Labs Lab 01/12/16 1410 01/13/16 0530 01/14/16 0529 01/15/16 0534  WBC 18.3* 15.8* 12.7* 8.4  HGB 13.8 11.8* 13.3 13.9  HCT 38.9* 32.7* 37.8* 39.0  MCV 76.4* 75.3* 74.9* 74.7*  PLT 197 165 181 [id number]   Basic Metabolic Panel:  Recent Labs Lab 01/12/16 1410 01/13/16 0530 01/14/16 0529 01/15/16 0534  NA 136 139 140 139  K 3.3* 3.8 3.5 3.3*  CL 104 109 106 106  CO2 22 24 25 24   GLUCOSE 119* 117* 111* 122*  BUN 10 12 11 10   CREATININE 0.86 0.92 0.95 0.85  CALCIUM 9.8 9.4 9.7 9.9   GFR: Estimated Creatinine Clearance: 92.4 mL/min (by C-G formula based on SCr of 0.85 mg/dL). Liver Function Tests:  Recent Labs Lab 01/12/16 1410  AST 26  ALT 23  ALKPHOS 79  BILITOT 0.8  PROT 8.0  ALBUMIN 4.7    Recent Labs Lab 01/12/16 1410  LIPASE 13   No results for input(s): AMMONIA in the last 168 hours. Coagulation Profile: No results for input(s): INR, PROTIME in the last 168 hours. Cardiac Enzymes:  Recent Labs Lab 01/12/16 1410  TROPONINI <0.03   BNP (last 3 results) No results for input(s): PROBNP in the last 8760 hours. HbA1C: No results for input(s): HGBA1C in the last 72 hours. CBG: No results for input(s): GLUCAP in the last 168 hours. Lipid Profile: No results for input(s): CHOL, HDL, LDLCALC, TRIG, CHOLHDL, LDLDIRECT in the last 72 hours. Thyroid Function Tests: No results for input(s): TSH, T4TOTAL, FREET4, T3FREE, THYROIDAB in the last 72 hours. Anemia Panel: No results for input(s): VITAMINB12, FOLATE, FERRITIN, TIBC, IRON, RETICCTPCT in the last 72 hours. Sepsis Labs:  Recent Labs Lab 01/12/16 1826  LATICACIDVEN 1.11    Recent Results (from the past 240 hour(s))  Urine  culture     Status: Abnormal   Collection Time: 01/12/16  4:00 PM  Result Value Ref Range Status   Specimen Description URINE, CLEAN CATCH  Final   Special Requests NONE  Final   Culture >=100,000 COLONIES/mL ESCHERICHIA COLI (A)  Final   Report Status 01/15/2016 FINAL  Final   Organism ID, Bacteria ESCHERICHIA COLI (A)  Final      Susceptibility   Escherichia coli - MIC*    AMPICILLIN <=2 SENSITIVE Sensitive     CEFAZOLIN <=4 SENSITIVE Sensitive     CEFTRIAXONE <=1 SENSITIVE Sensitive     CIPROFLOXACIN <=0.25 SENSITIVE Sensitive     GENTAMICIN <=1 SENSITIVE Sensitive     IMIPENEM <=0.25 SENSITIVE Sensitive     NITROFURANTOIN <=16 SENSITIVE Sensitive     TRIMETH/SULFA <=20 SENSITIVE Sensitive     AMPICILLIN/SULBACTAM <=2 SENSITIVE Sensitive     PIP/TAZO <=4 SENSITIVE Sensitive     Extended ESBL NEGATIVE Sensitive     * >=  100,000 COLONIES/mL ESCHERICHIA COLI         Radiology Studies: Dg Abd 1 View  Result Date: 01/13/2016 CLINICAL DATA:  Patient with nausea and vomiting. Patient status post placement of right ureteral stent. EXAM: ABDOMEN - 1 VIEW COMPARISON:  CT 01/12/2016. FINDINGS: Right double-J nephro ureteral stent is visualized and appears in appropriate position. Oral contrast material within the bowel. Pelvic phleboliths. Lumbar spine degenerative changes. IMPRESSION: Right double-J nephro ureteral stent appears in appropriate position. Nonobstructed bowel gas pattern. Electronically Signed   By: Lovey Newcomer M.D.   On: 01/13/2016 18:49        Scheduled Meds: . amLODipine  5 mg Oral Daily  . antiseptic oral rinse  7 mL Mouth Rinse q12n4p  . calcium carbonate  1 tablet Oral TID WC  . cefTRIAXone (ROCEPHIN)  IV  2 g Intravenous Q24H  . chlorhexidine  15 mL Mouth Rinse BID  . docusate sodium  100 mg Oral BID  . enoxaparin (LOVENOX) injection  40 mg Subcutaneous QHS  . oxybutynin  10 mg Oral Daily  . pravastatin  5 mg Oral QHS   Continuous Infusions:     LOS: 3  days    Time spent: 35 min     Kelvin Cellar, MD [facility] Pager [phone]  If 7PM-7AM, please contact night-coverage [email or web address] Password [email or web address] 01/15/2016, 12:50 PM

## 2016-01-15 NOTE — Care Management Important Message (Signed)
Important Message  Patient Details  Name: Perry [name] MRN: [id number] Date of Birth: [birth year]   Medicare Important Message Given:  Yes    Apolonio Schneiders, RN 01/15/2016, 12:36 PM

## 2016-01-16 MED ORDER — ISOSORBIDE MONONITRATE ER 30 MG PO TB24: 30.0000 mg | ORAL_TABLET | Freq: Every day | ORAL | 1 refills | Status: DC

## 2016-01-16 MED ORDER — ALUM & MAG HYDROXIDE-SIMETH 200-200-20 MG/5ML PO SUSP
30.0000 mL | ORAL | Status: DC | PRN
  Administered 2016-01-16: 30 mL via ORAL
  Filled 2016-01-16: qty 30

## 2016-01-16 MED ORDER — CIPROFLOXACIN HCL 500 MG PO TABS: 500.0000 mg | ORAL_TABLET | Freq: Two times a day (BID) | ORAL | 0 refills | Status: DC

## 2016-01-16 MED ORDER — AMLODIPINE BESYLATE 10 MG PO TABS: 10.0000 mg | ORAL_TABLET | Freq: Every day | ORAL | 1 refills | Status: DC

## 2016-01-16 NOTE — Discharge Summary (Addendum)
Physician Discharge Summary  Perry [name] [id number] DOB: 05/13/1956 DOA: 01/12/2016  PCP: Gerome Sam, MD  Admit date: 01/12/2016 Discharge date: 01/16/2016  Time spent: 35 minutes  Recommendations for Outpatient Follow-up:  1. Please follow-up on blood pressures, during this hospitalization he was hypertensive and started on Norvasc and Imdur 2. Patient admitted for postobstructive nephropathy, discharged on ciprofloxacin 500 mg by mouth twice a day   Discharge Diagnoses:  Principal Problem:   Right ureteral stone Active Problems:   Renal mass   Hyperlipidemia   Hypokalemia   Leukocytosis   UTI (urinary tract infection)   Discharge Condition: Stable  Diet recommendation: Heart healthy  Filed Weights   01/12/16 1339 01/12/16 2330 01/15/16 0605  Weight: 99.3 kg (219 lb) 87.1 kg (192 lb 1.6 oz) 84.5 kg (186 lb 3.2 oz)    History of present illness:  Perry [name] is a 60 y.o. male with medical history significant of prostate CA s/p robotic prostatectomy in 2012 and known right renal mass from hospitalization in [id number] (uncertain f/u) presenting to [facility] with abdominal pain/n/v.  He was found to have a 20mm distal right ureteral stone and resultant right hydronephrosis with WBC 18,000 and was transferred to [facility] for urology consultation.  Hospitalist called for admission but the patient was already in the OR by the time I came to see him in the ER.  I evaluated him in the PACU and he was still anesthetized and thus unable to provide additional history.  Hospital Course:  Perry [name] is a 60 year old male with a history of prostate cancer, presenting to the emergency room at [facility] on 01/12/2016 with complaints of abdominal pain associated with nausea and vomiting. Workup revealed a right hydronephrosis associate with a 6 mm stone in the distal right ureter. He was transferred to [facility] where he was evaluated by urology. Overnight he underwent  cystourethroscopy with placement of right ureteral stent, procedure performed by Dr. Karsten Ro of urology.  1.  Obstructive uropathy -Perry [name] is a 60 year old gentleman presenting with nausea vomiting abdominal pain, with imaging studies performed at [facility] showing right sided hydronephrosis associated with 6 mm obstructing stone. -Urology was consulted as he underwent cystourethroscopy with placement of stent. -Will follow urology in the outpatient setting  2.  Nausea/vomiting -Perry [name] continues to have intractable nausea vomiting requiring IV antiemetic therapy -He was worked up with a KUB on 01/13/2016 that did not show evidence of ileus or small bowel obstruction -On 01/15/2016 he started showing significant clinical improvement, did not require IV antiemetic therapy as his diet was advanced  3.  Urinary tract infection -Secondary to obstructive uropathy, urinalysis showed the presence of many bacteria and white blood cells -Lab showing downward trend in white count from 18,300-15,800 -Urine cultures growing greater than 100,000 colony-forming units of Escherichia coli, organism which is susceptible. -On 01/15/2016 his IV ceftriaxone was stopped as he was started on ciprofloxacin 500 mg by mouth twice a day  4.  History of right renal mass -CT scan showing solid exophytic lesion in the midpole of right kidney -Currently being followed at the [facility] -Urology also following  5.  Hypokalemia -Potassium 3.3, replaced with oral potassium  6.  Hypertension -Perry [name] was hypertensive during this hospitalization and started on Norvasc 10 mg by mouth daily and Imdur 15 mg by mouth daily. Pressures remain elevated, plan to discharge him on Imdur 30 mg by mouth daily and Norvasc 10 mg by mouth daily -Please follow-up  on blood pressures on hospital follow-up visit he was directed to check his blood pressures at home  Procedures: 1. Cystourethroscopy 2. Right retrograde  pyelogram 3. Right diagnostic ureteroscopy 4. Right ureteral stent placement, 74F x 24 cm JJ ureteral stent without string  Consultations:  Urology  Discharge Exam: Vitals:   01/15/16 2117 01/16/16 0522  BP: 137/79 (!) 153/92  Pulse: 69 (!) 59  Resp: 18 18  Temp: 99.1 F (37.3 C) 99.8 F (37.7 C)   General exam: He looks much better today, awake and alert, interactive, NAD Respiratory system: Clear to auscultation. Respiratory effort normal. Cardiovascular system: S1 & S2 heard, RRR. No JVD, murmurs, rubs, gallops or clicks. No pedal edema. Gastrointestinal system: There is mild generalized tenderness to palpation over her abdomen Central nervous system: Alert and oriented. No focal neurological deficits. Extremities: Symmetric 5 x 5 power. Skin: No rashes, lesions or ulcers Psychiatry: Judgement and insight appear normal. Mood & affect appropriate.    Discharge Instructions   Discharge Instructions    Call MD for:    Complete by:  As directed   Call MD for:  difficulty breathing, headache or visual disturbances    Complete by:  As directed   Call MD for:  extreme fatigue    Complete by:  As directed   Call MD for:  hives    Complete by:  As directed   Call MD for:  persistant dizziness or light-headedness    Complete by:  As directed   Call MD for:  persistant nausea and vomiting    Complete by:  As directed   Call MD for:  redness, tenderness, or signs of infection (pain, swelling, redness, odor or green/yellow discharge around incision site)    Complete by:  As directed   Call MD for:  severe uncontrolled pain    Complete by:  As directed   Call MD for:  temperature >100.4    Complete by:  As directed   Diet - low sodium heart healthy    Complete by:  As directed   Increase activity slowly    Complete by:  As directed     Current Discharge Medication List    START taking these medications   Details  amLODipine (NORVASC) 10 MG tablet Take 1 tablet (10 mg total) by  mouth daily. Qty: 30 tablet, Refills: 1    ciprofloxacin (CIPRO) 500 MG tablet Take 1 tablet (500 mg total) by mouth 2 (two) times daily. Qty: 8 tablet, Refills: 0    isosorbide mononitrate (IMDUR) 30 MG 24 hr tablet Take 1 tablet (30 mg total) by mouth daily. Qty: 30 tablet, Refills: 1      CONTINUE these medications which have NOT CHANGED   Details  Cholecalciferol 1000 units tablet Take 1,000 Units by mouth daily.    Omega-3 Fatty Acids (FISH OIL) 1000 MG CAPS Take 1 capsule by mouth 2 (two) times daily.    oxybutynin (DITROPAN-XL) 10 MG 24 hr tablet Take 20 mg by mouth daily.    pravastatin (PRAVACHOL) 10 MG tablet Take 5 mg by mouth at bedtime.    prochlorperazine (COMPAZINE) 10 MG tablet Take 1 tablet by mouth daily as needed for nausea or vomiting.     sildenafil (VIAGRA) 100 MG tablet Take 100 mg by mouth daily as needed for erectile dysfunction.       Allergies  Allergen Reactions  . Aspirin Other (See Comments)    unknown   Follow-up Information  Claybon Jabs, MD.   Specialty:  Urology Why:  Call to schedule an appointment in about 1 week when you get home. Contact information: [address] [place] [address] [phone]            The results of significant diagnostics from this hospitalization (including imaging, microbiology, ancillary and laboratory) are listed below for reference.    Significant Diagnostic Studies: Dg Chest 2 View  Result Date: 01/12/2016 CLINICAL DATA:  Low-grade fever, smoker EXAM: CHEST  2 VIEW COMPARISON:  Radiograph 09/21/2015 FINDINGS: Exam is lordotic. No effusion, infiltrate pneumothorax. Normal cardiac silhouette. Low lung volumes. IMPRESSION: No acute cardiopulmonary process.  Low lung volumes. Electronically Signed   By: Suzy Bouchard M.D.   On: 01/12/2016 15:23   Dg Abd 1 View  Result Date: 01/13/2016 CLINICAL DATA:  Patient with nausea and vomiting. Patient status post placement of right ureteral stent.  EXAM: ABDOMEN - 1 VIEW COMPARISON:  CT 01/12/2016. FINDINGS: Right double-J nephro ureteral stent is visualized and appears in appropriate position. Oral contrast material within the bowel. Pelvic phleboliths. Lumbar spine degenerative changes. IMPRESSION: Right double-J nephro ureteral stent appears in appropriate position. Nonobstructed bowel gas pattern. Electronically Signed   By: Lovey Newcomer M.D.   On: 01/13/2016 18:49   Ct Abdomen Pelvis W Contrast  Result Date: 01/12/2016 CLINICAL DATA:  Generalized abdomen pain, nausea vomiting since yesterday EXAM: CT ABDOMEN AND PELVIS WITH CONTRAST TECHNIQUE: Multidetector CT imaging of the abdomen and pelvis was performed using the standard protocol following bolus administration of intravenous contrast. CONTRAST:  145mL ISOVUE-300 IOPAMIDOL (ISOVUE-300) INJECTION 61% COMPARISON:  September 21, 2015 CT abdomen pelvis, MRI of the abdomen September 22, 2015 FINDINGS: Lower chest: There is mild atelectasis of the posterior lung bases. There is no focal pneumonia or pleural effusion. There is a small hiatal hernia. The heart size is normal. Hepatobiliary: The liver and gallbladder are normal. No masses or other significant abnormality. Pancreas: No mass, inflammatory changes, or other significant abnormality. Spleen: Within normal limits in size and appearance. Adrenals/Urinary Tract: There is a 2.3 cm mass in the left adrenal gland unchanged compared to prior MRI of abdomen and prior CT of abdomen of April 2017. The right adrenal gland is normal. There is a 3 cm enhancing mass in the lateral midpole right kidney unchanged compared to prior exams. This was found to be consistent with renal cell carcinoma on prior MRI of abdomen dated April 2017. There are simple cysts within the left kidney. There is right hydroureteronephrosis due to obstruction by a 6 mm stone in the distal right ureter. Nonobstructing stone is identified within the left kidney. Stomach/Bowel: No evidence of  obstruction, inflammatory process, or abnormal fluid collections. Vascular/Lymphatic: No pathologically enlarged lymph nodes. No evidence of abdominal aortic aneurysm. Atherosclerosis of the abdominal aorta is identified. Reproductive: No mass or other significant abnormality. Other: None. Musculoskeletal: No suspicious bone lesions identified. Degenerative joint changes of the spine are noted. IMPRESSION: Right hydroureteronephrosis due to obstruction by 6 mm stone in the distal right ureter likely accounting for the patient's acute symptoms. Solid exophytic lesion in the midpole right kidney suspicious for renal cell carcinoma previously worked up with MRI of the abdomen September 22, 2015. No evidence of renal vein invasion. Indeterminate lesion in the left adrenal gland unchanged compared to prior MRI of the abdomen dated September 22, 2015. Electronically Signed   By: Abelardo Diesel M.D.   On: 01/12/2016 16:37   Dg C-arm 1-60 Min-no Report  Result Date: 01/12/2016 CLINICAL DATA: surgery C-ARM 1-60 MINUTES Fluoroscopy was utilized by the requesting physician.  No radiographic interpretation.    Microbiology: Recent Results (from the past 240 hour(s))  Urine culture     Status: Abnormal   Collection Time: 01/12/16  4:00 PM  Result Value Ref Range Status   Specimen Description URINE, CLEAN CATCH  Final   Special Requests NONE  Final   Culture >=100,000 COLONIES/mL ESCHERICHIA COLI (A)  Final   Report Status 01/15/2016 FINAL  Final   Organism ID, Bacteria ESCHERICHIA COLI (A)  Final      Susceptibility   Escherichia coli - MIC*    AMPICILLIN <=2 SENSITIVE Sensitive     CEFAZOLIN <=4 SENSITIVE Sensitive     CEFTRIAXONE <=1 SENSITIVE Sensitive     CIPROFLOXACIN <=0.25 SENSITIVE Sensitive     GENTAMICIN <=1 SENSITIVE Sensitive     IMIPENEM <=0.25 SENSITIVE Sensitive     NITROFURANTOIN <=16 SENSITIVE Sensitive     TRIMETH/SULFA <=20 SENSITIVE Sensitive     AMPICILLIN/SULBACTAM <=2 SENSITIVE Sensitive      PIP/TAZO <=4 SENSITIVE Sensitive     Extended ESBL NEGATIVE Sensitive     * >=100,000 COLONIES/mL ESCHERICHIA COLI     Labs: Basic Metabolic Panel:  Recent Labs Lab 01/12/16 1410 01/13/16 0530 01/14/16 0529 01/15/16 0534  NA 136 139 140 139  K 3.3* 3.8 3.5 3.3*  CL 104 109 106 106  CO2 22 24 25 24   GLUCOSE 119* 117* 111* 122*  BUN 10 12 11 10   CREATININE 0.86 0.92 0.95 0.85  CALCIUM 9.8 9.4 9.7 9.9   Liver Function Tests:  Recent Labs Lab 01/12/16 1410  AST 26  ALT 23  ALKPHOS 79  BILITOT 0.8  PROT 8.0  ALBUMIN 4.7    Recent Labs Lab 01/12/16 1410  LIPASE 13   No results for input(s): AMMONIA in the last 168 hours. CBC:  Recent Labs Lab 01/12/16 1410 01/13/16 0530 01/14/16 0529 01/15/16 0534  WBC 18.3* 15.8* 12.7* 8.4  HGB 13.8 11.8* 13.3 13.9  HCT 38.9* 32.7* 37.8* 39.0  MCV 76.4* 75.3* 74.9* 74.7*  PLT 197 165 181 185   Cardiac Enzymes:  Recent Labs Lab 01/12/16 1410  TROPONINI <0.03   BNP: BNP (last 3 results) No results for input(s): BNP in the last 8760 hours.  ProBNP (last 3 results) No results for input(s): PROBNP in the last 8760 hours.  CBG: No results for input(s): GLUCAP in the last 168 hours.     Signed:  Kelvin Cellar MD.  [facility] 01/16/2016, 10:36 AM

## 2016-01-31 DIAGNOSIS — N201 Calculus of ureter: Secondary | ICD-10-CM | POA: Diagnosis not present

## 2016-02-01 ENCOUNTER — Other Ambulatory Visit: Payer: Self-pay | Admitting: Urology

## 2016-02-15 ENCOUNTER — Encounter (HOSPITAL_BASED_OUTPATIENT_CLINIC_OR_DEPARTMENT_OTHER): Payer: Self-pay | Admitting: *Deleted

## 2016-02-15 NOTE — Progress Notes (Signed)
NPO AFTER MN.  ARRIVE AT [id number].  NEEDS ISTAT.  CURRENT EKG IN CHART AND EPIC.  WILL TAKE AM MEDS W/ SIPS OF WATER DOS.

## 2016-02-17 NOTE — H&P (Signed)
HPI: Perry [name] is a 60 year-old male established patient with a right ureteral calculus.  He has right ureteral obstruction. He has had stentfor treatment of his ureteral calculi. Patient denies ureteroscopy, eswl, and percutaneous lithotomy. This procedure was done 01/12/2016. This is his first kidney stone. He does have a stent(s) in place.   He is not currently having flank pain, back pain, groin pain, nausea, vomiting, fever or chills.   He does have dysuria. He does have urgency. He does have frequency.   He had a 6 mm distal right ureteral stone that required emergency stenting due to possible infection. He is tolerating the stent well with some slight irritation in the bladder with urination. He has not seen his stone passed.     ALLERGIES: Aspirin TABS    MEDICATIONS: Cipro  Tamsulosin Hcl  Amlodipine Besylate  Baclofen  Oxybutynin Chloride Er  Pravastatin Sodium  Sildenafil Citrate  Vitamin D (Ergocalciferol) 50000 UNIT Oral Capsule Oral     GU PSH: Cystoscopy Insert Stent, Right - 01/12/2016 Cystoscopy Ureteroscopy, Right - 01/12/2016      PSH Notes: Prostatectomy Robotic-Assisted   NON-GU PSH: None   GU PMH: ED, arterial insufficiency, Erectile dysfunction due to arterial insufficiency - 2014 Prostate Cancer, Prostate cancer - 2014 Stress Incontinence, M/F, Male stress incontinence - 2014 Renal Cysts, Simple, Bilateral    NON-GU PMH: Personal history of other endocrine, nutritional and metabolic disease, History of hypercholesterolemia - 2014 Encounter for general adult medical examination without abnormal findings, Encounter for preventive health examination Gastro-esophageal reflux disease without esophagitis Pure hypercholesterolemia, unspecified    FAMILY HISTORY: 1 Daughter - Daughter 3 sons - Son Acute Myocardial Infarction - Mother Brain Cancer - Father Prostate Cancer - Grandfather   SOCIAL HISTORY: Marital Status: Single Current Smoking Status:  Patient does not smoke anymore.  Does not drink anymore.  Does not use drugs. Does not drink caffeine.     Notes: Tobacco Use, Marital History - Separated, Alcohol Use   REVIEW OF SYSTEMS:    GU Review Male:   Patient reports get up at night to urinate and leakage of urine. Patient denies frequent urination, hard to postpone urination, burning/ pain with urination, stream starts and stops, trouble starting your stream, have to strain to urinate , erection problems, and penile pain.  Gastrointestinal (Upper):   Patient denies nausea, vomiting, and indigestion/ heartburn.  Gastrointestinal (Lower):   Patient denies constipation and diarrhea.  Constitutional:   Patient denies fever, night sweats, weight loss, and fatigue.  Skin:   Patient denies skin rash/ lesion and itching.  Eyes:   Patient denies blurred vision and double vision.  Ears/ Nose/ Throat:   Patient denies sore throat and sinus problems.  Hematologic/Lymphatic:   Patient denies swollen glands and easy bruising.  Cardiovascular:   Patient denies leg swelling and chest pains.  Respiratory:   Patient denies cough and shortness of breath.  Endocrine:   Patient denies excessive thirst.  Musculoskeletal:   Patient denies back pain and joint pain.  Neurological:   Patient denies headaches and dizziness.  Psychologic:   Patient denies depression and anxiety.   VITAL SIGNS:    Weight 189 lb / 85.73 kg  Height 69 in / 175.26 cm  BP 95/68 mmHg  Pulse 70 /min  Temperature 97.3 F / 36 C  BMI 27.9 kg/m   Physical Exam:  Constitutional:  Alert and oriented, appears uncomfortable Respiratory: Normal respiratory effort  GI: Abdomen is soft, tender in RUQ,  nondistended, no abdominal masses, prior well healed incisions GU: Right CVA tenderness Neurologic: Grossly intact, no focal deficits Psychiatric: Normal mood and affect  PAST DATA REVIEWED:  Source Of History:  Patient, Outside Source  Records Review:   Previous Patient Records   X-Ray Review: C.T. Abdomen/Pelvis: Reviewed Films. His CT scan images were compared to his KUB today.    03/31/12 06/15/11 09/26/10 06/09/10 02/03/10  PSA  Total PSA 0.04  <0.01  <0.01  3.60  4.57     PROCEDURES:         KUB - 74000  A single view of the abdomen is obtained.               Urinalysis w/Scope - 81001 Dipstick Dipstick Cont'd Micro  Specimen: Voided Bilirubin: Neg WBC/hpf: 5-10  Color: Amber Ketones: Neg RBC/hpf: >60/hpf  Appearance: Cloudy Blood: 3+ Bacteria: Rare  Specific Gravity: 1.025 Protein: 3+ Cystals: NS (Not Seen)  pH: 6.5 Urobilinogen: 0.2 Casts: NS (Not Seen)  Glucose: Neg Nitrites: Neg Trichomonas: Not Present    Leukocyte Esterase: 1+ Mucous: Present      Epithelial Cells: 0-5/hpf      Yeast: NS (Not Seen)      Sperm: Not Present    ASSESSMENT:      ICD-10 Details  1 GU:   Calculus Ureter - N20.1 Right, Stable - I could not visualize his stone on his KUB today although it was located over the bony pelvis. He has not seen his stone pass. I therefore have recommended treatment since it was 6 mm in size and I do not think it would pass spontaneously when his stent is removed. I therefore have recommended ureteroscopy since it can't be visualized for lithotripsy. I went over the procedure with him in detail including how the procedure is performed, its risks and complications, the alternatives, the probability of success as well as the outpatient nature of the procedure and the anticipated postoperative course. I told him that he may or may not have to have a stent after the procedure. He understands and is elected to proceed.     PLAN:    Removal of currently indwelling right ureteral stent with right ureteroscopy and possible laser lithotripsy and stone extraction as well as possible stent replacement.

## 2016-02-20 ENCOUNTER — Ambulatory Visit (HOSPITAL_BASED_OUTPATIENT_CLINIC_OR_DEPARTMENT_OTHER): Payer: Medicare Other | Admitting: Anesthesiology

## 2016-02-20 ENCOUNTER — Ambulatory Visit (HOSPITAL_BASED_OUTPATIENT_CLINIC_OR_DEPARTMENT_OTHER)
Admission: RE | Admit: 2016-02-20 | Discharge: 2016-02-20 | Disposition: A | Discharge status: Home / Self Care | Payer: Medicare Other | Source: Ambulatory Visit | Attending: Urology | Admitting: Urology

## 2016-02-20 ENCOUNTER — Encounter (HOSPITAL_BASED_OUTPATIENT_CLINIC_OR_DEPARTMENT_OTHER): Payer: Self-pay | Admitting: *Deleted

## 2016-02-20 ENCOUNTER — Encounter (HOSPITAL_BASED_OUTPATIENT_CLINIC_OR_DEPARTMENT_OTHER)
Admission: RE | Disposition: A | Discharge status: Home / Self Care | Payer: Self-pay | Source: Ambulatory Visit | Attending: Urology

## 2016-02-20 DIAGNOSIS — Z79899 Other long term (current) drug therapy: Secondary | ICD-10-CM | POA: Diagnosis not present

## 2016-02-20 DIAGNOSIS — E7800 Pure hypercholesterolemia, unspecified: Secondary | ICD-10-CM | POA: Diagnosis not present

## 2016-02-20 DIAGNOSIS — I739 Peripheral vascular disease, unspecified: Secondary | ICD-10-CM | POA: Diagnosis not present

## 2016-02-20 DIAGNOSIS — K219 Gastro-esophageal reflux disease without esophagitis: Secondary | ICD-10-CM | POA: Diagnosis not present

## 2016-02-20 DIAGNOSIS — Z87891 Personal history of nicotine dependence: Secondary | ICD-10-CM | POA: Insufficient documentation

## 2016-02-20 DIAGNOSIS — N201 Calculus of ureter: Secondary | ICD-10-CM

## 2016-02-20 DIAGNOSIS — I10 Essential (primary) hypertension: Secondary | ICD-10-CM | POA: Insufficient documentation

## 2016-02-20 HISTORY — DX: Diaphragmatic hernia without obstruction or gangrene: K44.9

## 2016-02-20 HISTORY — DX: Unspecified symptoms and signs involving the genitourinary system: R39.9

## 2016-02-20 HISTORY — DX: Personal history of malignant neoplasm of prostate: Z85.46

## 2016-02-20 HISTORY — DX: Unspecified abnormalities of gait and mobility: R26.9

## 2016-02-20 HISTORY — DX: Gastro-esophageal reflux disease without esophagitis: K21.9

## 2016-02-20 HISTORY — DX: Adverse effect of unspecified anesthetic, initial encounter: T41.45XA

## 2016-02-20 HISTORY — DX: Calculus of ureter: N20.1

## 2016-02-20 HISTORY — PX: CYSTOSCOPY/URETEROSCOPY/HOLMIUM LASER/STENT PLACEMENT: SHX6546

## 2016-02-20 HISTORY — DX: Essential (primary) hypertension: I10

## 2016-02-20 HISTORY — DX: Peripheral vascular disease, unspecified: I73.9

## 2016-02-20 HISTORY — DX: Presence of spectacles and contact lenses: Z97.3

## 2016-02-20 HISTORY — DX: Presence of urogenital implants: Z96.0

## 2016-02-20 HISTORY — DX: Other complications of anesthesia, initial encounter: T88.59XA

## 2016-02-20 HISTORY — DX: Polyneuropathy, unspecified: G62.9

## 2016-02-20 LAB — POCT I-STAT 4, (NA,K, GLUC, HGB,HCT)
Glucose, Bld: 97 mg/dL (ref 65–99)
HCT: 38 % — ABNORMAL LOW (ref 39.0–52.0)
Hemoglobin: 12.9 g/dL — ABNORMAL LOW (ref 13.0–17.0)
Potassium: 4 mmol/L (ref 3.5–5.1)
Sodium: 145 mmol/L (ref 135–145)

## 2016-02-20 SURGERY — CYSTOSCOPY/URETEROSCOPY/HOLMIUM LASER/STENT PLACEMENT
Anesthesia: General | Site: Ureter | Laterality: Right

## 2016-02-20 MED ORDER — KETOROLAC TROMETHAMINE 30 MG/ML IJ SOLN
30.0000 mg | Freq: Once | INTRAMUSCULAR | Status: AC | PRN
  Administered 2016-02-20: 30 mg via INTRAVENOUS
  Filled 2016-02-20: qty 1

## 2016-02-20 MED ORDER — IOHEXOL 300 MG/ML  SOLN
INTRAMUSCULAR | Status: DC | PRN
  Administered 2016-02-20: 8 mL

## 2016-02-20 MED ORDER — PROPOFOL 10 MG/ML IV BOLUS
INTRAVENOUS | Status: DC | PRN
  Administered 2016-02-20: 40 mg via INTRAVENOUS
  Administered 2016-02-20: 50 mg via INTRAVENOUS
  Administered 2016-02-20: 200 mg via INTRAVENOUS
  Administered 2016-02-20: 60 mg via INTRAVENOUS

## 2016-02-20 MED ORDER — LACTATED RINGERS IV SOLN
INTRAVENOUS | Status: DC
  Administered 2016-02-20 (×3): via INTRAVENOUS
  Filled 2016-02-20: qty 1000

## 2016-02-20 MED ORDER — METOCLOPRAMIDE HCL 5 MG/ML IJ SOLN
INTRAMUSCULAR | Status: AC
  Filled 2016-02-20: qty 2

## 2016-02-20 MED ORDER — PHENAZOPYRIDINE HCL 200 MG PO TABS: 200.0000 mg | ORAL_TABLET | Freq: Three times a day (TID) | ORAL | 0 refills | Status: DC | PRN

## 2016-02-20 MED ORDER — CIPROFLOXACIN IN D5W 200 MG/100ML IV SOLN
200.0000 mg | INTRAVENOUS | Status: AC
  Administered 2016-02-20: 200 mg via INTRAVENOUS
  Filled 2016-02-20: qty 100

## 2016-02-20 MED ORDER — DEXAMETHASONE SODIUM PHOSPHATE 4 MG/ML IJ SOLN
INTRAMUSCULAR | Status: DC | PRN
  Administered 2016-02-20: 10 mg via INTRAVENOUS

## 2016-02-20 MED ORDER — HYDROCODONE-ACETAMINOPHEN 10-325 MG PO TABS
ORAL_TABLET | ORAL | Status: AC
  Filled 2016-02-20: qty 1

## 2016-02-20 MED ORDER — ONDANSETRON HCL 4 MG/2ML IJ SOLN
INTRAMUSCULAR | Status: AC
  Filled 2016-02-20: qty 2

## 2016-02-20 MED ORDER — KETOROLAC TROMETHAMINE 30 MG/ML IJ SOLN
INTRAMUSCULAR | Status: AC
  Filled 2016-02-20: qty 1

## 2016-02-20 MED ORDER — PHENAZOPYRIDINE HCL 100 MG PO TABS
ORAL_TABLET | ORAL | Status: AC
Start: 2016-02-20 — End: 2016-02-20
  Filled 2016-02-20: qty 2

## 2016-02-20 MED ORDER — PHENAZOPYRIDINE HCL 200 MG PO TABS
200.0000 mg | ORAL_TABLET | Freq: Once | ORAL | Status: AC
  Administered 2016-02-20: 200 mg via ORAL
  Filled 2016-02-20: qty 1

## 2016-02-20 MED ORDER — PROMETHAZINE HCL 25 MG/ML IJ SOLN
6.2500 mg | INTRAMUSCULAR | Status: DC | PRN
  Administered 2016-02-20: 6.25 mg via INTRAVENOUS
  Filled 2016-02-20: qty 1

## 2016-02-20 MED ORDER — LIDOCAINE 2% (20 MG/ML) 5 ML SYRINGE
INTRAMUSCULAR | Status: AC
  Filled 2016-02-20: qty 5

## 2016-02-20 MED ORDER — PROPOFOL 10 MG/ML IV BOLUS
INTRAVENOUS | Status: AC
  Filled 2016-02-20: qty 40

## 2016-02-20 MED ORDER — CIPROFLOXACIN IN D5W 200 MG/100ML IV SOLN
INTRAVENOUS | Status: AC
  Filled 2016-02-20: qty 100

## 2016-02-20 MED ORDER — LIDOCAINE HCL (CARDIAC) 20 MG/ML IV SOLN
INTRAVENOUS | Status: DC | PRN
  Administered 2016-02-20: 80 mg via INTRAVENOUS

## 2016-02-20 MED ORDER — HYDROCODONE-ACETAMINOPHEN 10-325 MG PO TABS: 1.0000 | ORAL_TABLET | ORAL | 0 refills | Status: DC | PRN

## 2016-02-20 MED ORDER — ONDANSETRON HCL 4 MG/2ML IJ SOLN
INTRAMUSCULAR | Status: DC | PRN
  Administered 2016-02-20 (×2): 4 mg via INTRAVENOUS

## 2016-02-20 MED ORDER — METOCLOPRAMIDE HCL 5 MG/ML IJ SOLN
INTRAMUSCULAR | Status: DC | PRN
  Administered 2016-02-20: 10 mg via INTRAVENOUS

## 2016-02-20 MED ORDER — ONDANSETRON HCL 4 MG/2ML IJ SOLN
INTRAMUSCULAR | Status: AC
  Filled 2016-02-20: qty 4

## 2016-02-20 MED ORDER — SODIUM CHLORIDE 0.9 % IR SOLN
Status: DC | PRN
  Administered 2016-02-20: 4000 mL

## 2016-02-20 MED ORDER — FENTANYL CITRATE (PF) 100 MCG/2ML IJ SOLN
INTRAMUSCULAR | Status: AC
  Filled 2016-02-20: qty 2

## 2016-02-20 MED ORDER — HYDROCODONE-ACETAMINOPHEN 10-325 MG PO TABS
1.0000 | ORAL_TABLET | Freq: Once | ORAL | Status: AC
  Administered 2016-02-20: 1 via ORAL
  Filled 2016-02-20: qty 2

## 2016-02-20 MED ORDER — DEXAMETHASONE SODIUM PHOSPHATE 10 MG/ML IJ SOLN
INTRAMUSCULAR | Status: AC
  Filled 2016-02-20: qty 1

## 2016-02-20 MED ORDER — FENTANYL CITRATE (PF) 100 MCG/2ML IJ SOLN
25.0000 ug | INTRAMUSCULAR | Status: DC | PRN
  Administered 2016-02-20 (×2): 50 ug via INTRAVENOUS
  Filled 2016-02-20: qty 1

## 2016-02-20 MED ORDER — PROMETHAZINE HCL 25 MG/ML IJ SOLN
INTRAMUSCULAR | Status: AC
  Filled 2016-02-20: qty 1

## 2016-02-20 MED ORDER — FENTANYL CITRATE (PF) 100 MCG/2ML IJ SOLN
INTRAMUSCULAR | Status: DC | PRN
  Administered 2016-02-20 (×2): 50 ug via INTRAVENOUS

## 2016-02-20 SURGICAL SUPPLY — 22 items
BAG DRAIN URO-CYSTO SKYTR STRL (DRAIN) ×2 IMPLANT
BASKET LASER NITINOL 1.9FR (BASKET) ×2 IMPLANT
CATH INTERMIT  6FR 70CM (CATHETERS) ×2 IMPLANT
CLOTH BEACON ORANGE TIMEOUT ST (SAFETY) ×2 IMPLANT
FIBER LASER FLEXIVA 365 (UROLOGICAL SUPPLIES) ×2 IMPLANT
GLOVE BIO SURGEON STRL SZ8 (GLOVE) ×2 IMPLANT
GLOVE BIOGEL PI IND STRL 7.5 (GLOVE) ×1 IMPLANT
GLOVE BIOGEL PI INDICATOR 7.5 (GLOVE) ×1
GLOVE INDICATOR 7.5 STRL GRN (GLOVE) ×2 IMPLANT
GOWN STRL REUS W/ TWL LRG LVL3 (GOWN DISPOSABLE) ×1 IMPLANT
GOWN STRL REUS W/ TWL XL LVL3 (GOWN DISPOSABLE) ×1 IMPLANT
GOWN STRL REUS W/TWL LRG LVL3 (GOWN DISPOSABLE) ×1
GOWN STRL REUS W/TWL XL LVL3 (GOWN DISPOSABLE) ×1
GUIDEWIRE STR DUAL SENSOR (WIRE) ×2 IMPLANT
IV NS 1000ML (IV SOLUTION) ×1
IV NS 1000ML BAXH (IV SOLUTION) ×1 IMPLANT
IV NS IRRIG 3000ML ARTHROMATIC (IV SOLUTION) ×2 IMPLANT
KIT ROOM TURNOVER WOR (KITS) ×2 IMPLANT
MANIFOLD NEPTUNE II (INSTRUMENTS) ×2 IMPLANT
PACK CYSTO (CUSTOM PROCEDURE TRAY) ×2 IMPLANT
STENT PERCUFLEX 4.8FRX24 (STENTS) ×2 IMPLANT
TUBE CONNECTING 12X1/4 (SUCTIONS) ×2 IMPLANT

## 2016-02-20 NOTE — Transfer of Care (Signed)
Immediate Anesthesia Transfer of Care Note  Patient: Perry [name]  Procedure(s) Performed: Procedure(s): CYSTOSCOPY/URETEROSCOPY/HOLMIUM LASER/STENT PLACEMENT AND REMOVAL OF STENT (Right)  Patient Location: PACU  Anesthesia Type:General  Level of Consciousness: awake, alert , oriented and patient cooperative  Airway & Oxygen Therapy: Patient Spontanous Breathing and Patient connected to nasal cannula oxygen  Post-op Assessment: Report given to RN and Post -op Vital signs reviewed and stable  Post vital signs: Reviewed and stable  Last Vitals:  Vitals:   02/20/16 0914  BP: 120/62  Pulse: 67  Resp: 20  Temp: 37 C    Last Pain:  Vitals:   02/20/16 0914  TempSrc: Oral      Patients Stated Pain Goal: 10 ([id number])  Complications: No apparent anesthesia complications

## 2016-02-20 NOTE — Anesthesia Procedure Notes (Signed)
Procedure Name: LMA Insertion Date/Time: 02/20/2016 10:08 AM Performed by: Myrtie Soman Pre-anesthesia Checklist: Patient identified, Timeout performed, Emergency Drugs available, Suction available and Patient being monitored Patient Re-evaluated:Patient Re-evaluated prior to inductionOxygen Delivery Method: Circle system utilized Preoxygenation: Pre-oxygenation with 100% oxygen Intubation Type: IV induction Ventilation: Mask ventilation without difficulty LMA: LMA inserted LMA Size: 4.0 Number of attempts: 1 Placement Confirmation: positive ETCO2 and breath sounds checked- equal and bilateral Tube secured with: Tape Dental Injury: Teeth and Oropharynx as per pre-operative assessment

## 2016-02-20 NOTE — Anesthesia Postprocedure Evaluation (Signed)
Anesthesia Post Note  Patient: Perry [name]  Procedure(s) Performed: Procedure(s) (LRB): CYSTOSCOPY/URETEROSCOPY/HOLMIUM LASER/STENT PLACEMENT AND REMOVAL OF STENT (Right)  Patient location during evaluation: PACU Anesthesia Type: General Level of consciousness: awake and alert Pain management: pain level controlled Vital Signs Assessment: post-procedure vital signs reviewed and stable Respiratory status: spontaneous breathing, nonlabored ventilation, respiratory function stable and patient connected to nasal cannula oxygen Cardiovascular status: blood pressure returned to baseline and stable Postop Assessment: no signs of nausea or vomiting Anesthetic complications: no    Last Vitals:  Vitals:   02/20/16 0914 02/20/16 1056  BP: 120/62 128/90  Pulse: 67 (!) 59  Resp: 20 15  Temp: 37 C     Last Pain:  Vitals:   02/20/16 1056  TempSrc:   PainSc: Asleep                 Briannon Boggio S

## 2016-02-20 NOTE — Op Note (Signed)
PATIENT:  Perry [name]  PRE-OPERATIVE DIAGNOSIS: 1  Right ureteral stent 2. right Ureteral calculus  POST-OPERATIVE DIAGNOSIS: Same  PROCEDURE:  1. Removal of right ureteral stent 2. Cystoscopy with right retrograde pyelogram including interpretation. 3. Right ureteroscopy with laser lithotripsy, stone extraction and stent placement   SURGEON: Claybon Jabs, MD  INDICATION: Mr. Robies is a 60 year old male who underwent emergent right ureteral stent placement due to an obstructing 6 mm stone and infection. He returns today for management of his right ureteral calculus. I could not visualize the stone on a KUB preoperatively in my office. We discussed removal of his stent with a retrograde pyelogram and then ureteroscopy and laser lithotripsy as necessary.  ANESTHESIA:  General  EBL:  Minimal  DRAINS: 4.8 French, 24 cm double-J stent in the right ureter (with string)   SPECIMEN:  Stone given to patient  DESCRIPTION OF PROCEDURE: The patient was taken to the major OR and placed on the table. General anesthesia was administered and then the patient was moved to the dorsal lithotomy position. The genitalia was sterilely prepped and draped. An official timeout was performed.  Initially the 70 French cystoscope with 30 lens was passed under direct vision into the bladder. The bladder was then fully inspected. It was noted be free of any tumors, stones or inflammatory lesions. Ureteral orifices were of normal configuration and position. The alligator grasping forceps were then passed through the cystoscope and the distal aspect of his right ureteral stent was grasped and withdrawn through the urethral meatus. A 0.038 inch floppy tip sensor guidewire was then passed through the stent and into the area of the right renal pelvis under fluoroscopy and the stent was removed.  A 6 French open-ended ureteral catheter was then passed through the cystoscope into the ureteral orifice in order to perform  a right retrograde pyelogram.  A retrograde pyelogram was performed by injecting full-strength contrast up the right ureter under direct fluoroscopic control. It revealed no definite stone but a question of some mild narrowing in the distal ureter where the stone had previously been located.  The remainder of the ureter was noted to be normal as was the intrarenal collecting system.  The guidewire was left in place and the cystoscope was removed.I then proceeded with ureteroscopy.  A 6 French rigid  ureteroscope was then passed under direct into the bladder and into the right orifice and up the ureter. The stone was identified and I felt it was too large to extract and therefore elected to proceed with laser lithotripsy. In addition the stone was noted to be impacted in the wall of the ureter. I used the tip of the ureteroscope to gently dislodge the stone from the ureteral wall and advanced it up the ureter about a centimeter. The 360  holmium laser fiber was used to fragment the stone into 3 pieces. I then used the escape  basket to extract all of the stone fragments and reinspection of the ureter ureteroscopically revealed no further stone fragments and no injury to the ureter.   I then backloaded the cystoscope over the guidewire and passed the stent over the guidewire into the area of the renal pelvis. As the guidewire was removed good curl was noted in the renal pelvis. The bladder was drained and the cystoscope was then removed.The string was affixed to the dorsum of the penis.  The patient tolerated the procedure well no intraoperative complications.  PLAN OF CARE: Discharge to home after PACU  PATIENT DISPOSITION:  PACU - hemodynamically stable.

## 2016-02-20 NOTE — Discharge Instructions (Signed)

## 2016-02-20 NOTE — Anesthesia Preprocedure Evaluation (Addendum)
Anesthesia Evaluation  Patient identified by MRN, date of birth, ID band Patient awake    Reviewed: Allergy & Precautions, NPO status , Patient's Chart, lab work & pertinent test results  Airway Mallampati: II  TM Distance: >3 FB Neck ROM: Full    Dental no notable dental hx. (+) Missing, Dental Advisory Given, Poor Dentition, Loose,    Pulmonary Current Smoker,    Pulmonary exam normal breath sounds clear to auscultation       Cardiovascular hypertension, Pt. on medications + Peripheral Vascular Disease  Normal cardiovascular exam Rhythm:Regular Rate:Normal     Neuro/Psych negative neurological ROS  negative psych ROS   GI/Hepatic Neg liver ROS, hiatal hernia, GERD  ,  Endo/Other  negative endocrine ROS  Renal/GU negative Renal ROS  negative genitourinary   Musculoskeletal negative musculoskeletal ROS (+)   Abdominal   Peds negative pediatric ROS (+)  Hematology negative hematology ROS (+)   Anesthesia Other Findings   Reproductive/Obstetrics negative OB ROS                           Anesthesia Physical Anesthesia Plan  ASA: III  Anesthesia Plan: General   Post-op Pain Management:    Induction: Intravenous  Airway Management Planned: LMA  Additional Equipment:   Intra-op Plan:   Post-operative Plan: Extubation in OR  Informed Consent: I have reviewed the patients History and Physical, chart, labs and discussed the procedure including the risks, benefits and alternatives for the proposed anesthesia with the patient or authorized representative who has indicated his/her understanding and acceptance.   Dental advisory given  Plan Discussed with: CRNA, Surgeon and Anesthesiologist  Anesthesia Plan Comments:        Anesthesia Quick Evaluation

## 2016-02-21 ENCOUNTER — Encounter (HOSPITAL_BASED_OUTPATIENT_CLINIC_OR_DEPARTMENT_OTHER): Payer: Self-pay | Admitting: Urology

## 2016-03-01 DIAGNOSIS — N201 Calculus of ureter: Secondary | ICD-10-CM | POA: Diagnosis not present

## 2016-04-13 DIAGNOSIS — N200 Calculus of kidney: Secondary | ICD-10-CM | POA: Diagnosis not present

## 2016-04-13 DIAGNOSIS — D49511 Neoplasm of unspecified behavior of right kidney: Secondary | ICD-10-CM | POA: Diagnosis not present

## 2016-05-07 ENCOUNTER — Encounter (HOSPITAL_COMMUNITY): Payer: Self-pay | Admitting: Emergency Medicine

## 2016-05-07 ENCOUNTER — Emergency Department (HOSPITAL_COMMUNITY): Payer: Medicare Other

## 2016-05-07 ENCOUNTER — Emergency Department (HOSPITAL_COMMUNITY)
Admission: EM | Admit: 2016-05-07 | Discharge: 2016-05-07 | Disposition: A | Discharge status: Home / Self Care | Payer: Medicare Other | Attending: Emergency Medicine | Admitting: Emergency Medicine

## 2016-05-07 DIAGNOSIS — Z79899 Other long term (current) drug therapy: Secondary | ICD-10-CM | POA: Insufficient documentation

## 2016-05-07 DIAGNOSIS — I10 Essential (primary) hypertension: Secondary | ICD-10-CM | POA: Insufficient documentation

## 2016-05-07 DIAGNOSIS — F17210 Nicotine dependence, cigarettes, uncomplicated: Secondary | ICD-10-CM | POA: Diagnosis not present

## 2016-05-07 DIAGNOSIS — K529 Noninfective gastroenteritis and colitis, unspecified: Secondary | ICD-10-CM | POA: Insufficient documentation

## 2016-05-07 DIAGNOSIS — Z8546 Personal history of malignant neoplasm of prostate: Secondary | ICD-10-CM | POA: Diagnosis not present

## 2016-05-07 DIAGNOSIS — N2889 Other specified disorders of kidney and ureter: Secondary | ICD-10-CM | POA: Insufficient documentation

## 2016-05-07 DIAGNOSIS — R1084 Generalized abdominal pain: Secondary | ICD-10-CM | POA: Diagnosis not present

## 2016-05-07 DIAGNOSIS — R112 Nausea with vomiting, unspecified: Secondary | ICD-10-CM | POA: Diagnosis present

## 2016-05-07 LAB — COMPREHENSIVE METABOLIC PANEL
ALBUMIN: 4.9 g/dL (ref 3.5–5.0)
ALT: 21 U/L (ref 17–63)
AST: 22 U/L (ref 15–41)
Alkaline Phosphatase: 91 U/L (ref 38–126)
Anion gap: 8 (ref 5–15)
BUN: 11 mg/dL (ref 6–20)
CHLORIDE: 105 mmol/L (ref 101–111)
CO2: 24 mmol/L (ref 22–32)
Calcium: 10.8 mg/dL — ABNORMAL HIGH (ref 8.9–10.3)
Creatinine, Ser: 0.9 mg/dL (ref 0.61–1.24)
GFR calc Af Amer: >60 mL/min (ref >60)
GFR calc non Af Amer: >60 mL/min (ref >60)
GLUCOSE: 134 mg/dL — AB (ref 65–99)
POTASSIUM: 4.3 mmol/L (ref 3.5–5.1)
SODIUM: 137 mmol/L (ref 135–145)
Total Bilirubin: 0.4 mg/dL (ref 0.3–1.2)
Total Protein: 8.1 g/dL (ref 6.5–8.1)

## 2016-05-07 LAB — LIPASE, BLOOD: LIPASE: 36 U/L (ref 11–51)

## 2016-05-07 LAB — CBC
HEMATOCRIT: 42.4 % (ref 39.0–52.0)
Hemoglobin: 14.7 g/dL (ref 13.0–17.0)
MCH: 26 pg (ref 26.0–34.0)
MCHC: 34.7 g/dL (ref 30.0–36.0)
MCV: 74.9 fL — AB (ref 78.0–100.0)
Platelets: 177 10*3/uL (ref 150–400)
RBC: 5.66 MIL/uL (ref 4.22–5.81)
RDW: 14 % (ref 11.5–15.5)
WBC: 9.7 10*3/uL (ref 4.0–10.5)

## 2016-05-07 MED ORDER — ONDANSETRON HCL 4 MG/2ML IJ SOLN
4.0000 mg | Freq: Once | INTRAMUSCULAR | Status: AC
  Administered 2016-05-07: 4 mg via INTRAVENOUS
  Filled 2016-05-07: qty 2

## 2016-05-07 MED ORDER — FENTANYL CITRATE (PF) 100 MCG/2ML IJ SOLN
50.0000 ug | Freq: Once | INTRAMUSCULAR | Status: AC
  Administered 2016-05-07: 50 ug via INTRAVENOUS
  Filled 2016-05-07: qty 2

## 2016-05-07 MED ORDER — LOPERAMIDE HCL 2 MG PO TABS: 2.0000 mg | ORAL_TABLET | Freq: Four times a day (QID) | ORAL | 0 refills | Status: DC | PRN

## 2016-05-07 MED ORDER — SODIUM CHLORIDE 0.9 % IV BOLUS (SEPSIS)
500.0000 mL | Freq: Once | INTRAVENOUS | Status: AC
Start: 2016-05-07 — End: 2016-05-07
  Administered 2016-05-07: 500 mL via INTRAVENOUS

## 2016-05-07 MED ORDER — HYDROMORPHONE HCL 1 MG/ML IJ SOLN
1.0000 mg | Freq: Once | INTRAMUSCULAR | Status: AC
  Administered 2016-05-07: 1 mg via INTRAVENOUS
  Filled 2016-05-07: qty 1

## 2016-05-07 MED ORDER — PROMETHAZINE HCL 25 MG/ML IJ SOLN
12.5000 mg | Freq: Once | INTRAMUSCULAR | Status: AC
  Administered 2016-05-07: 12.5 mg via INTRAVENOUS
  Filled 2016-05-07: qty 1

## 2016-05-07 MED ORDER — HYDROCODONE-ACETAMINOPHEN 5-325 MG PO TABS: 1.0000 | ORAL_TABLET | Freq: Four times a day (QID) | ORAL | 0 refills | Status: DC | PRN

## 2016-05-07 MED ORDER — PROMETHAZINE HCL 25 MG PO TABS: 25.0000 mg | ORAL_TABLET | Freq: Four times a day (QID) | ORAL | 1 refills | Status: DC | PRN

## 2016-05-07 MED ORDER — IOPAMIDOL (ISOVUE-300) INJECTION 61%
INTRAVENOUS | Status: AC
  Administered 2016-05-07: 30 mL via ORAL
  Filled 2016-05-07: qty 30

## 2016-05-07 MED ORDER — SODIUM CHLORIDE 0.9 % IV SOLN: INTRAVENOUS | Status: DC

## 2016-05-07 NOTE — ED Notes (Signed)
C/o severe nausea

## 2016-05-07 NOTE — ED Provider Notes (Signed)
[facility] DEPT Provider Note   CSN: [id number] Arrival date & time: 05/07/16  1422     History   Chief Complaint Chief Complaint  Patient presents with  . Emesis    HPI Perry [name] is a 60 y.o. male.  Patient with acute onset of nausea vomiting and diarrhea 3 in the morning. Multiple episodes since then. Associated crampy abdominal pain. No fevers. No blood in the urine. Crampy abdominal pain is intermittent. No constant abdominal pain. No sick contacts.      Past Medical History:  Diagnosis Date  . Abnormal gait    uses two sticks to walk  . Bilateral carotid artery stenosis    20-39%  . Claudication, intermittent ([place])   . Complication of anesthesia    "hard to wake and confused"  . GERD (gastroesophageal reflux disease)   . Hiatal hernia   . History of prostate cancer    08-07-2010  s/p  radical prostatectomy by dr borden  . Hyperlipidemia   . Hypertension   . Lower urinary tract symptoms (LUTS)   . Neuropathy, peripheral ([place])   . Retained ureteral stent    right side  . Right renal mass    09-22-2015 per MRI consistant with renal cell carcinoma  . Right ureteral stone   . Wears glasses     Patient Active Problem List   Diagnosis Date Noted  . Sepsis ([place]) 01/12/2016  . Carotid stenosis 01/12/2016  . Malignant neoplasm of prostate ([place]) 01/12/2016  . Hyperlipidemia 01/12/2016  . Right ureteral stone 01/12/2016  . Hypokalemia 01/12/2016  . Leukocytosis 01/12/2016  . UTI (urinary tract infection) 01/12/2016  . Renal mass 09/21/2015  . Spastic gait 02/15/2012    Past Surgical History:  Procedure Laterality Date  . COLONOSCOPY  04/25/2007  . CYSTOSCOPY W/ URETERAL STENT PLACEMENT Right 01/12/2016   Procedure: CYSTOSCOPY WITH RETROGRADE PYELOGRAM/URETERAL STENT PLACEMENT;  Surgeon: Kathie Rhodes, MD;  Location: [facility];  Service: Urology;  Laterality: Right;  . CYSTOSCOPY/URETEROSCOPY/HOLMIUM LASER/STENT PLACEMENT Right 02/20/2016   Procedure:  CYSTOSCOPY/URETEROSCOPY/HOLMIUM LASER/STENT PLACEMENT AND REMOVAL OF STENT;  Surgeon: Kathie Rhodes, MD;  Location: [facility];  Service: Urology;  Laterality: Right;  . ROBOT ASSISTED LAPAROSCOPIC RADICAL PROSTATECTOMY  08-07-2010  dr Alinda Money       Home Medications    Prior to Admission medications   Medication Sig Start Date End Date Taking? Authorizing Provider  amLODipine (NORVASC) 10 MG tablet Take 1 tablet (10 mg total) by mouth daily. Patient taking differently: Take 10 mg by mouth every morning.  01/16/16  Yes Kelvin Cellar, MD  isosorbide mononitrate (IMDUR) 30 MG 24 hr tablet Take 1 tablet (30 mg total) by mouth daily. Patient taking differently: Take 30 mg by mouth every morning.  01/16/16  Yes Kelvin Cellar, MD  HYDROcodone-acetaminophen (NORCO) 10-325 MG tablet Take 1-2 tablets by mouth every 4 (four) hours as needed for moderate pain. Maximum dose per 24 hours - 8 pills Patient not taking: Reported on 05/07/2016 02/20/16   Kathie Rhodes, MD  HYDROcodone-acetaminophen (NORCO/VICODIN) 5-325 MG tablet Take 1-2 tablets by mouth every 6 (six) hours as needed. 05/07/16   Fredia Sorrow, MD  loperamide (IMODIUM A-D) 2 MG tablet Take 1 tablet (2 mg total) by mouth 4 (four) times daily as needed for diarrhea or loose stools. 05/07/16   Fredia Sorrow, MD  phenazopyridine (PYRIDIUM) 200 MG tablet Take 1 tablet (200 mg total) by mouth 3 (three) times daily as needed for pain. Patient not taking: Reported  on 05/07/2016 02/20/16   Kathie Rhodes, MD  promethazine (PHENERGAN) 25 MG tablet Take 1 tablet (25 mg total) by mouth every 6 (six) hours as needed. 05/07/16   Fredia Sorrow, MD    Family History Family History  Problem Relation Age of Onset  . Diabetes Mother   . Hypertension Mother   . Hypertension      family history     Social History Social History  Substance Use Topics  . Smoking status: Current Every Day Smoker    Packs/day: 1.00    Years: 45.00     Types: Cigarettes  . Smokeless tobacco: Never Used  . Alcohol use No     Allergies   Aspirin   Review of Systems Review of Systems  Constitutional: Negative for fever.  HENT: Negative for congestion.   Eyes: Negative for redness.  Respiratory: Negative for shortness of breath.   Cardiovascular: Negative for chest pain.  Gastrointestinal: Positive for abdominal pain, diarrhea, nausea and vomiting.  Genitourinary: Negative for dysuria.  Musculoskeletal: Negative for back pain.  Skin: Negative for rash.  Neurological: Negative for headaches.  Hematological: Does not bruise/bleed easily.  Psychiatric/Behavioral: Negative for confusion.     Physical Exam Updated Vital Signs BP 156/98 (BP Location: Right Arm)   Pulse 64   Temp 98.5 F (36.9 C) (Oral)   Resp 17   Ht 5\' 9"  (1.753 m)   Wt 85.7 kg   SpO2 100%   BMI 27.91 kg/m   Physical Exam  Constitutional: He is oriented to person, place, and time. He appears well-developed and well-nourished. No distress.  HENT:  Head: Normocephalic and atraumatic.  Mucous membranes slightly dry.  Eyes: EOM are normal. Pupils are equal, round, and reactive to light.  Neck: Normal range of motion. Neck supple.  Cardiovascular: Normal rate, regular rhythm and normal heart sounds.   Pulmonary/Chest: Effort normal and breath sounds normal.  Abdominal: Soft. Bowel sounds are normal. He exhibits no distension. There is no tenderness.  Musculoskeletal: Normal range of motion. He exhibits no edema.  Neurological: He is alert and oriented to person, place, and time. No cranial nerve deficit or sensory deficit. He exhibits normal muscle tone. Coordination normal.  Skin: Skin is warm.  Nursing note and vitals reviewed.    ED Treatments / Results  Labs (all labs ordered are listed, but only abnormal results are displayed) Labs Reviewed  COMPREHENSIVE METABOLIC PANEL - Abnormal; Notable for the following:       Result Value   Glucose, Bld  134 (*)    Calcium 10.8 (*)    All other components within normal limits  CBC - Abnormal; Notable for the following:    MCV 74.9 (*)    All other components within normal limits  LIPASE, BLOOD  URINALYSIS, ROUTINE W REFLEX MICROSCOPIC (NOT AT [facility])   Results for orders placed or performed during the hospital encounter of 05/07/16  Lipase, blood  Result Value Ref Range   Lipase 36 11 - 51 U/L  Comprehensive metabolic panel  Result Value Ref Range   Sodium 137 135 - 145 mmol/L   Potassium 4.3 3.5 - 5.1 mmol/L   Chloride 105 101 - 111 mmol/L   CO2 24 22 - 32 mmol/L   Glucose, Bld 134 (H) 65 - 99 mg/dL   BUN 11 6 - 20 mg/dL   Creatinine, Ser 0.90 0.61 - 1.24 mg/dL   Calcium 10.8 (H) 8.9 - 10.3 mg/dL   Total Protein 8.1 6.5 -  8.1 g/dL   Albumin 4.9 3.5 - 5.0 g/dL   AST 22 15 - 41 U/L   ALT 21 17 - 63 U/L   Alkaline Phosphatase 91 38 - 126 U/L   Total Bilirubin 0.4 0.3 - 1.2 mg/dL   GFR calc non Af Amer >60 >60 mL/min   GFR calc Af Amer >60 >60 mL/min   Anion gap 8 5 - 15  CBC  Result Value Ref Range   WBC 9.7 4.0 - 10.5 K/uL   RBC 5.66 4.22 - 5.81 MIL/uL   Hemoglobin 14.7 13.0 - 17.0 g/dL   HCT 42.4 39.0 - 52.0 %   MCV 74.9 (L) 78.0 - 100.0 fL   MCH 26.0 26.0 - 34.0 pg   MCHC 34.7 30.0 - 36.0 g/dL   RDW 14.0 11.5 - 15.5 %   Platelets 177 150 - 400 K/uL     EKG  EKG Interpretation None       Radiology Ct Abdomen Pelvis W Contrast  Result Date: 05/07/2016 CLINICAL DATA:  Awoke vomiting today. Persistent vomiting. Generalized abdominal pain. EXAM: CT ABDOMEN AND PELVIS WITH CONTRAST TECHNIQUE: Multidetector CT imaging of the abdomen and pelvis was performed using the standard protocol following bolus administration of intravenous contrast. CONTRAST:  27mL ISOVUE-300 IOPAMIDOL (ISOVUE-300) INJECTION 61% COMPARISON:  01/12/2016.  04/23/2015.  MRI 09/22/2015. FINDINGS: Lower chest: Normal Hepatobiliary: Normal Pancreas: Normal Spleen: Normal Adrenals/Urinary Tract: Right  adrenal gland is normal. Left adrenal mass again demonstrated measuring 2.3 cm in diameter. Margins are indistinct. Seventy-five Hounsfield units on the enhanced study. This was indeterminate at previous MRI. This has not enlarged since 04/23/2015 however, which would be unusual for malignancy. Left kidney contains multiple renal cysts. Solid mass of the right lateral kidney has enlarged, measuring 3.4 x 3.7 cm today compared with 3.1 x 3.2 cm on 09/21/2015. This remains most consistent with renal cell carcinoma. Nonobstructing 4 mm stone remains evident in the left renal pelvis. Stomach/Bowel: Small hiatal hernia. The stomach is not distended. No sign of small bowel obstruction. The colon is unremarkable. Vascular/Lymphatic: Aortic atherosclerosis. No aneurysm. The IVC is normal. No retroperitoneal adenopathy. Reproductive: Normal except for previous prostatectomy and either a bladder diverticulum or cyst of the seminal vesicle on the right. Other: No free fluid or air. Musculoskeletal: Negative IMPRESSION: No abnormality seen to explain acute and persistent vomiting. Solid right renal mass has enlarged since April, now measuring 3.4 x 3.7 cm, remaining consistent with renal cell carcinoma. Has this been sufficiently addressed? Stable left adrenal enlargement. Imaging characteristics not typical of benign adenoma, but lack of enlargement since 04/23/2015 argues against a malignant etiology. Multiple left renal cysts. Aortic atherosclerosis. Electronically Signed   By: Nelson Chimes M.D.   On: 05/07/2016 19:26    Procedures Procedures (including critical care time)  Medications Ordered in ED Medications  0.9 %  sodium chloride infusion ( Intravenous Rate/Dose Change 05/07/16 1648)  promethazine (PHENERGAN) injection 12.5 mg (not administered)  HYDROmorphone (DILAUDID) injection 1 mg (not administered)  ondansetron (ZOFRAN) injection 4 mg (4 mg Intravenous Given 05/07/16 1536)  sodium chloride 0.9 % bolus  500 mL (0 mLs Intravenous Stopped 05/07/16 1648)  fentaNYL (SUBLIMAZE) injection 50 mcg (50 mcg Intravenous Given 05/07/16 1603)  ondansetron (ZOFRAN) injection 4 mg (4 mg Intravenous Given 05/07/16 1703)  iopamidol (ISOVUE-300) 61 % injection (30 mLs Oral Contrast Given 05/07/16 1647)     Initial Impression / Assessment and Plan / ED Course  I have reviewed the triage vital  signs and the nursing notes.  Pertinent labs & imaging results that were available during my care of the patient were reviewed by me and considered in my medical decision making (see chart for details).  Clinical Course     Patient with acute onset of vomiting and diarrhea 3 this morning. Several episodes. No blood in either one. No fevers. Associated with crampy abdominal pain.  Extensive workup to include CT of abdomen and pelvis without any acute findings on the abdomen other than increased size of an already known renal mass. Patient is being followed by urology and by the [facility] for this. Patient states that he has workup ongoing.  Patient received IV hydration here. Patient improved with Phenergan and Zofran for the nausea and vomiting. The diarrhea did not extensive while here. Patient will be treated symptomatically. This may very well be a viral type illness.  Final Clinical Impressions(s) / ED Diagnoses   Final diagnoses:  Gastroenteritis  Renal mass    New Prescriptions New Prescriptions   HYDROCODONE-ACETAMINOPHEN (NORCO/VICODIN) 5-325 MG TABLET    Take 1-2 tablets by mouth every 6 (six) hours as needed.   LOPERAMIDE (IMODIUM A-D) 2 MG TABLET    Take 1 tablet (2 mg total) by mouth 4 (four) times daily as needed for diarrhea or loose stools.   PROMETHAZINE (PHENERGAN) 25 MG TABLET    Take 1 tablet (25 mg total) by mouth every 6 (six) hours as needed.     Fredia Sorrow, MD 05/07/16 2114

## 2016-05-07 NOTE — ED Triage Notes (Signed)
Pt reports he woke up vomiting this am. Pt states he has had multiple episodes of emesis.

## 2016-05-07 NOTE — Discharge Instructions (Signed)
The renal mass has gotten bigger. Important that you follow-up with either local urologist or year team at the [facility] hospital soon. Symptoms consistent with a vomiting and diarrhea illness. May very well be viral. Take the Imodium right ear for the diarrhea. Take Phenergan for the nausea and vomiting. Take the hydrocodone as needed for pain. Return for any new or worse symptoms. Return if vomiting and diarrhea persist.

## 2017-03-21 IMAGING — CT CT ABD-PELV W/ CM
2 of 5 series · 16 of 46 positions shown, 18 images · IV contrast (APPLIED)
Comparison: 01/12/2016.  04/23/2015.  MRI 09/22/2015.

CLINICAL DATA: Awoke vomiting today. Persistent vomiting.
Generalized abdominal pain.

EXAM:
CT ABDOMEN AND PELVIS WITH CONTRAST
TECHNIQUE: Multidetector CT imaging of the abdomen and pelvis was performed
using the standard protocol following bolus administration of
intravenous contrast.
CONTRAST:  30mL 2F8FB0-NOO IOPAMIDOL (2F8FB0-NOO) INJECTION 61%

[Series 2: axial st · axial · 0.68mm/px · z∈[+870,+1260]mm · 13 of 90 slices shown, 15 images]
[im 6/90  soft-tissue]
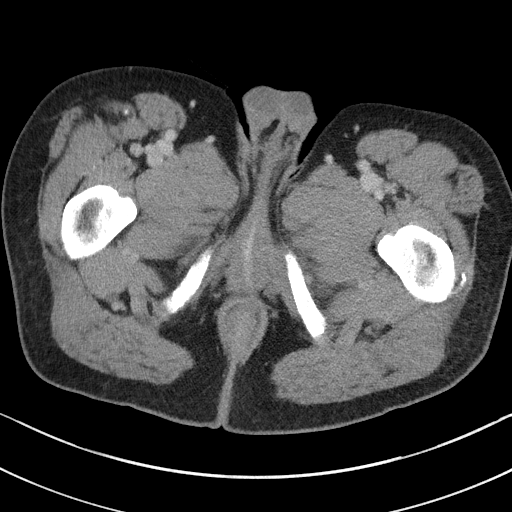
[im 6/90  bone]
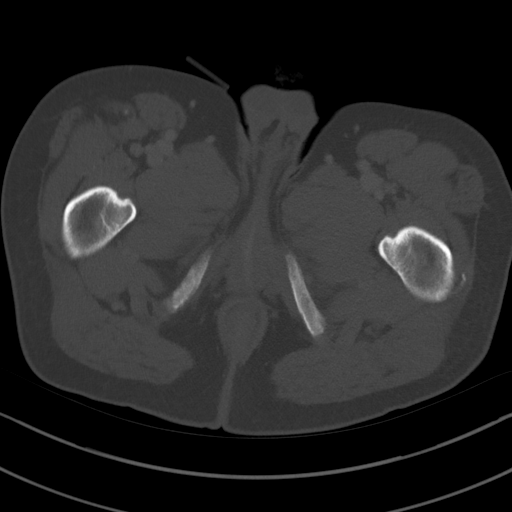
[im 11/90  soft-tissue]
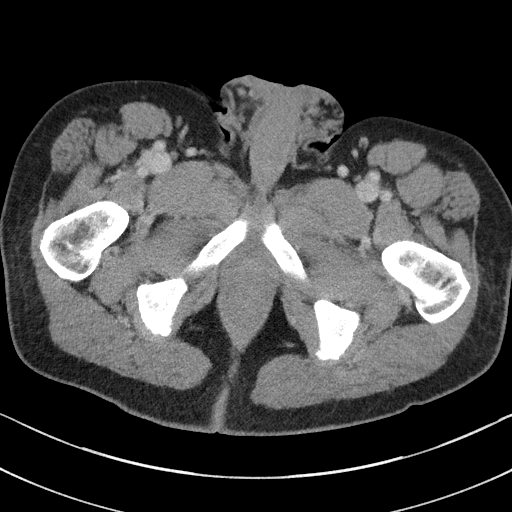
[im 21/90  soft-tissue]
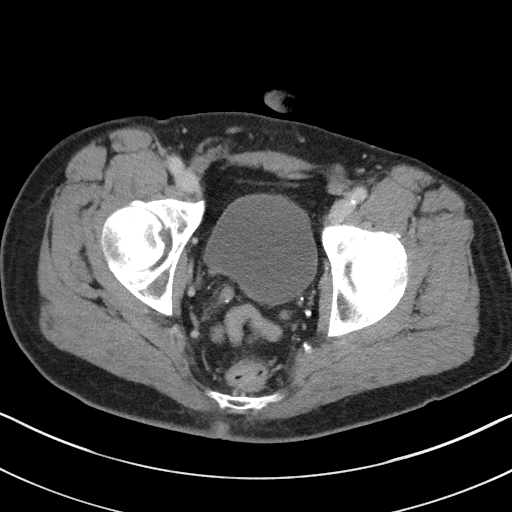
[im 27/90  soft-tissue]
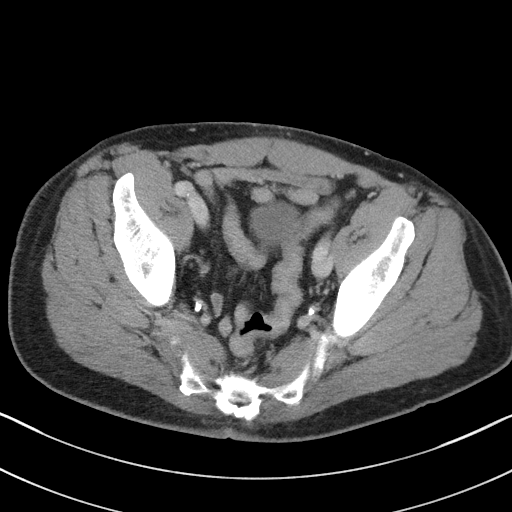
[im 32/90  soft-tissue]
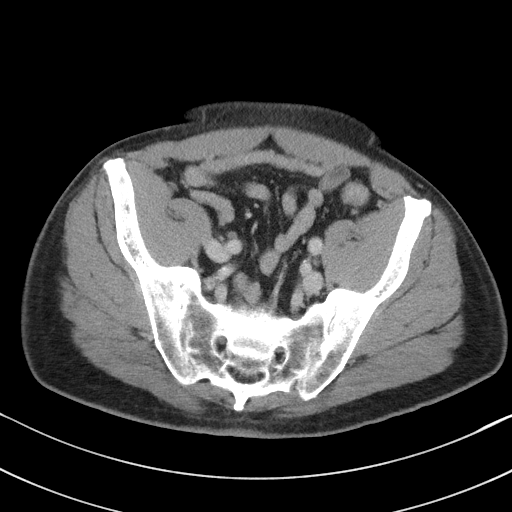
[im 37/90  soft-tissue]
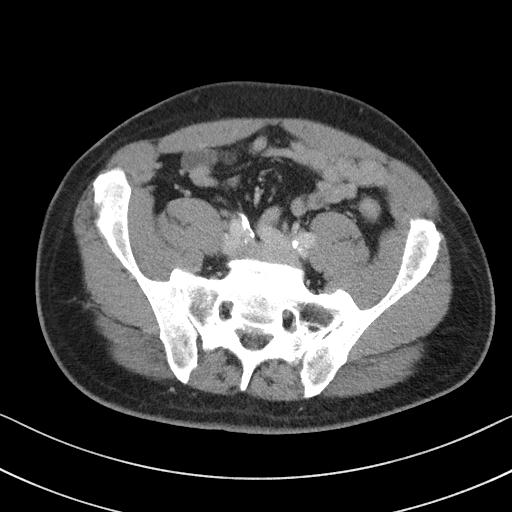
[im 48/90  soft-tissue]
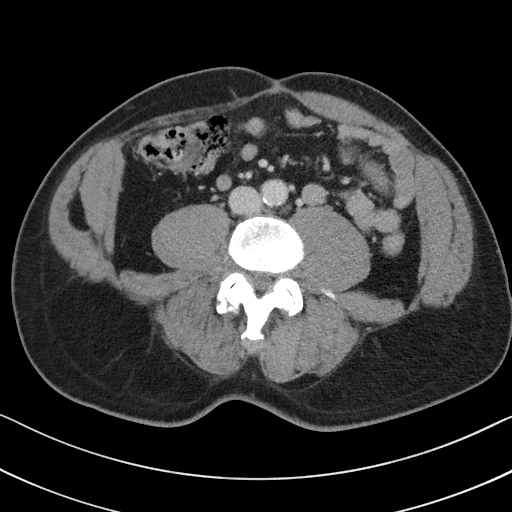
[im 53/90  soft-tissue]
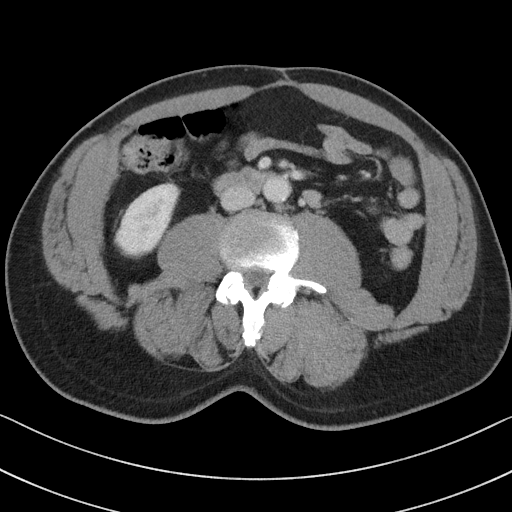
[im 58/90  soft-tissue]
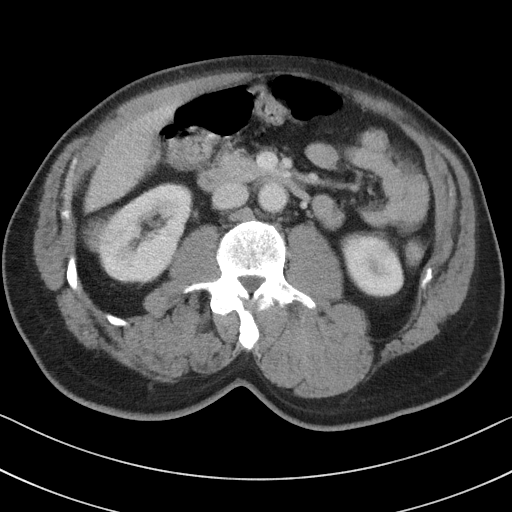
[im 58/90  bone]
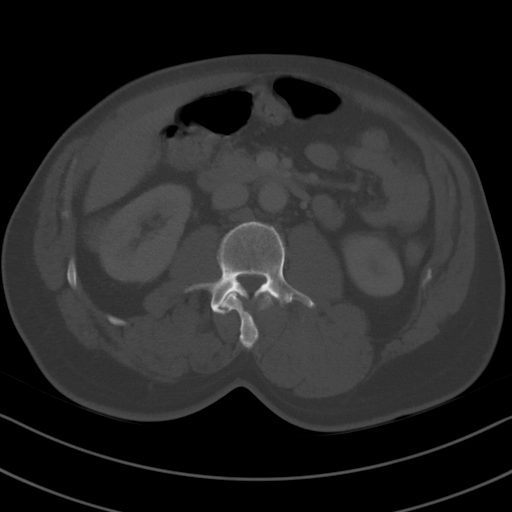
[im 63/90  soft-tissue]
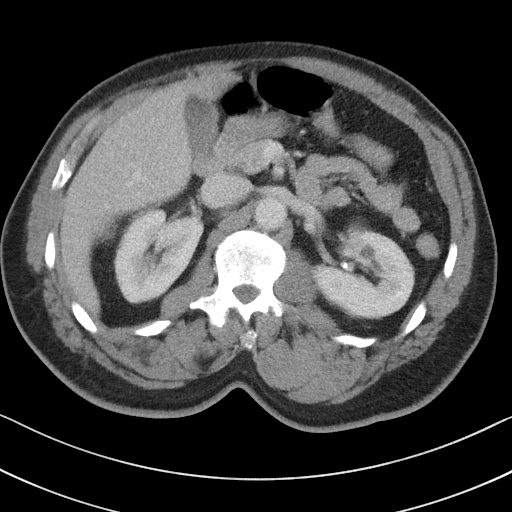
[im 69/90  soft-tissue]
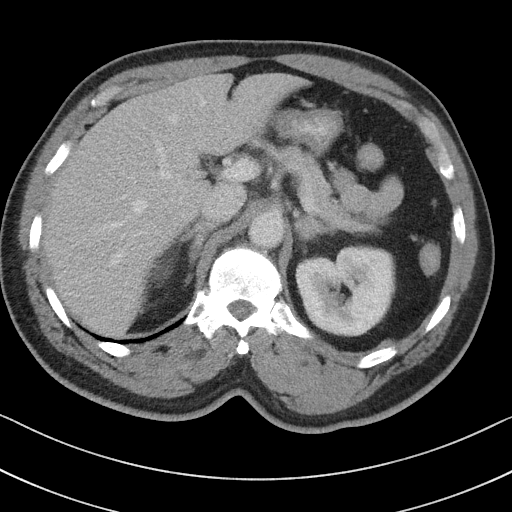
[im 79/90  soft-tissue]
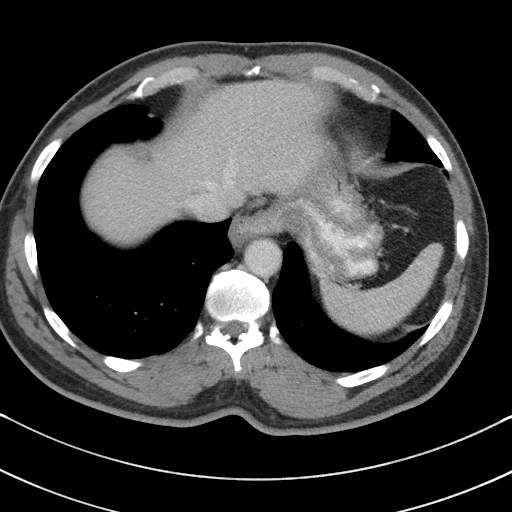
[im 84/90  soft-tissue]
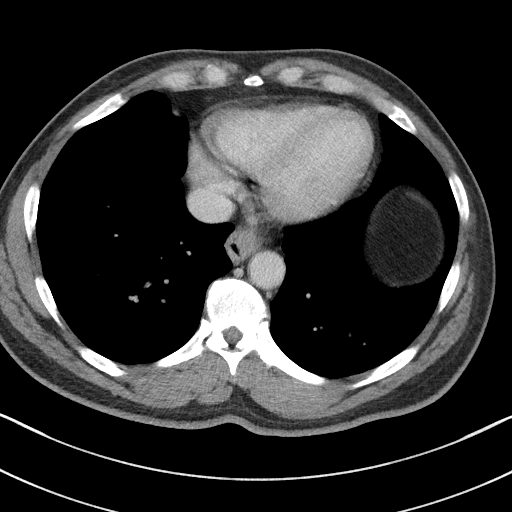

[Series 6: coronal st · coronal · 0.72mm/px · 3 of 92 slices shown]
[im 31/92  soft-tissue]
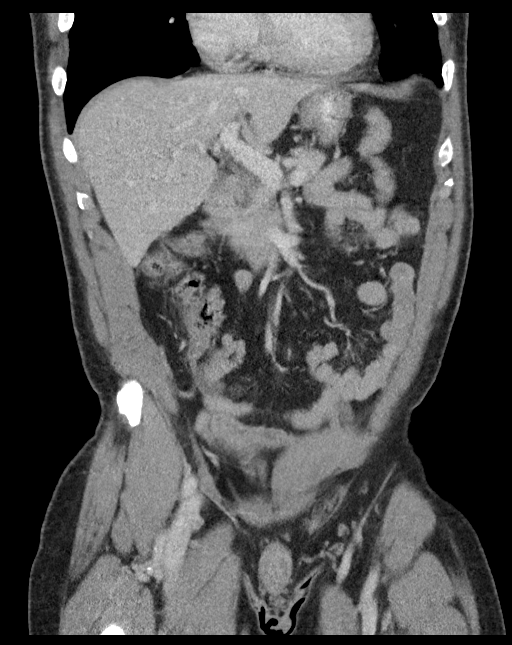
[im 41/92  soft-tissue]
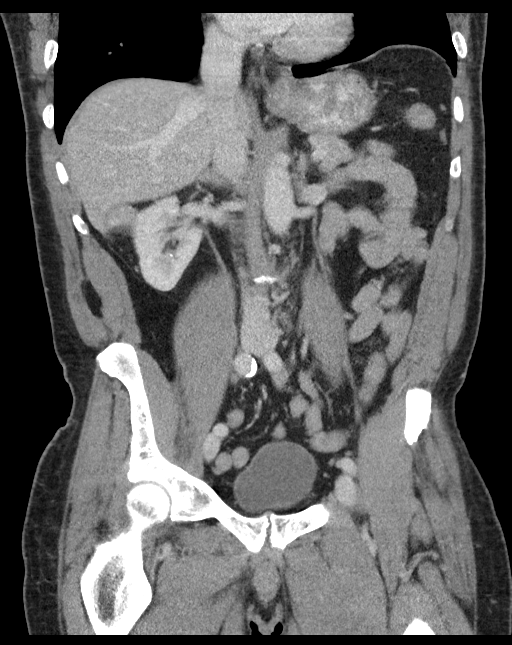
[im 51/92  soft-tissue]
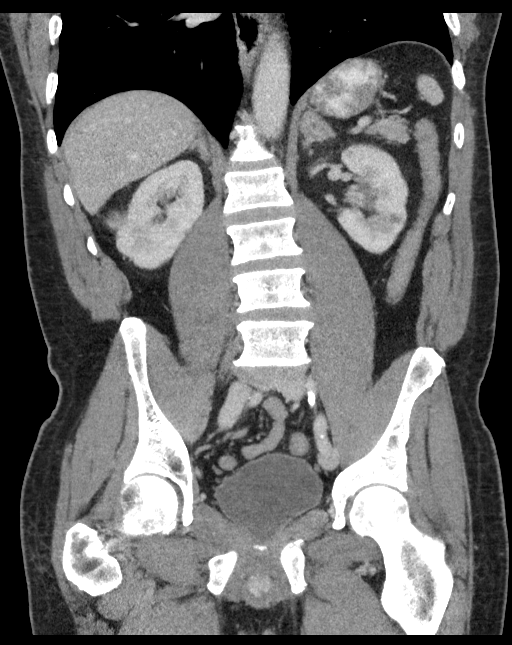

[16 of 46 positions shown; findings below may reference images not displayed]

FINDINGS: Lower chest: Normal

Hepatobiliary: Normal

Pancreas: Normal

Spleen: Normal

Adrenals/Urinary Tract: Right adrenal gland is normal. Left adrenal
mass again demonstrated measuring 2.3 cm in diameter. Margins are
indistinct. Seventy-five Hounsfield units on the enhanced study.
This was indeterminate at previous MRI. This has not enlarged since
04/23/2015 however, which would be unusual for malignancy. Left
kidney contains multiple renal cysts. Solid mass of the right
lateral kidney has enlarged, measuring 3.4 x 3.7 cm today compared
with 3.1 x 3.2 cm on 09/21/2015. This remains most consistent with
renal cell carcinoma. Nonobstructing 4 mm stone remains evident in
the left renal pelvis.

Stomach/Bowel: Small hiatal hernia. The stomach is not distended. No
sign of small bowel obstruction. The colon is unremarkable.

Vascular/Lymphatic: Aortic atherosclerosis. No aneurysm. The IVC is
normal. No retroperitoneal adenopathy.

Reproductive: Normal except for previous prostatectomy and either a
bladder diverticulum or cyst of the seminal vesicle on the right.

Other: No free fluid or air.

Musculoskeletal: Negative
IMPRESSION: No abnormality seen to explain acute and persistent vomiting.

Solid right renal mass has enlarged since [place], now measuring 3.4 x
3.7 cm, remaining consistent with renal cell carcinoma. Has this
been sufficiently addressed?

Stable left adrenal enlargement. Imaging characteristics not typical
of benign adenoma, but lack of enlargement since 04/23/2015 argues
against a malignant etiology.

Multiple left renal cysts.

Aortic atherosclerosis.

## 2017-07-31 DIAGNOSIS — M2550 Pain in unspecified joint: Secondary | ICD-10-CM | POA: Diagnosis not present

## 2017-07-31 DIAGNOSIS — G629 Polyneuropathy, unspecified: Secondary | ICD-10-CM | POA: Diagnosis not present

## 2017-07-31 DIAGNOSIS — N529 Male erectile dysfunction, unspecified: Secondary | ICD-10-CM | POA: Diagnosis not present

## 2017-07-31 DIAGNOSIS — N400 Enlarged prostate without lower urinary tract symptoms: Secondary | ICD-10-CM | POA: Diagnosis not present

## 2017-07-31 DIAGNOSIS — E785 Hyperlipidemia, unspecified: Secondary | ICD-10-CM | POA: Diagnosis not present

## 2017-07-31 DIAGNOSIS — K219 Gastro-esophageal reflux disease without esophagitis: Secondary | ICD-10-CM | POA: Diagnosis not present

## 2017-07-31 DIAGNOSIS — N3281 Overactive bladder: Secondary | ICD-10-CM | POA: Diagnosis not present

## 2017-07-31 DIAGNOSIS — R32 Unspecified urinary incontinence: Secondary | ICD-10-CM | POA: Diagnosis not present

## 2017-07-31 DIAGNOSIS — Z720 Tobacco use: Secondary | ICD-10-CM | POA: Diagnosis not present

## 2017-07-31 DIAGNOSIS — G8929 Other chronic pain: Secondary | ICD-10-CM | POA: Diagnosis not present

## 2018-10-17 DIAGNOSIS — K219 Gastro-esophageal reflux disease without esophagitis: Secondary | ICD-10-CM | POA: Diagnosis not present

## 2018-10-17 DIAGNOSIS — M62838 Other muscle spasm: Secondary | ICD-10-CM | POA: Diagnosis not present

## 2018-10-17 DIAGNOSIS — M4800 Spinal stenosis, site unspecified: Secondary | ICD-10-CM | POA: Diagnosis not present

## 2018-10-17 DIAGNOSIS — G8929 Other chronic pain: Secondary | ICD-10-CM | POA: Diagnosis not present

## 2018-10-17 DIAGNOSIS — R32 Unspecified urinary incontinence: Secondary | ICD-10-CM | POA: Diagnosis not present

## 2018-10-17 DIAGNOSIS — E669 Obesity, unspecified: Secondary | ICD-10-CM | POA: Diagnosis not present

## 2018-10-17 DIAGNOSIS — R42 Dizziness and giddiness: Secondary | ICD-10-CM | POA: Diagnosis not present

## 2018-10-17 DIAGNOSIS — N400 Enlarged prostate without lower urinary tract symptoms: Secondary | ICD-10-CM | POA: Diagnosis not present

## 2018-10-17 DIAGNOSIS — G629 Polyneuropathy, unspecified: Secondary | ICD-10-CM | POA: Diagnosis not present

## 2018-10-17 DIAGNOSIS — E785 Hyperlipidemia, unspecified: Secondary | ICD-10-CM | POA: Diagnosis not present

## 2018-10-23 DIAGNOSIS — G629 Polyneuropathy, unspecified: Secondary | ICD-10-CM | POA: Diagnosis not present

## 2018-10-28 DIAGNOSIS — G6181 Chronic inflammatory demyelinating polyneuritis: Secondary | ICD-10-CM | POA: Diagnosis not present

## 2019-01-23 DIAGNOSIS — G6189 Other inflammatory polyneuropathies: Secondary | ICD-10-CM | POA: Diagnosis not present

## 2019-08-21 DIAGNOSIS — G6189 Other inflammatory polyneuropathies: Secondary | ICD-10-CM | POA: Diagnosis not present

## 2019-08-21 DIAGNOSIS — G3289 Other specified degenerative disorders of nervous system in diseases classified elsewhere: Secondary | ICD-10-CM | POA: Diagnosis not present

## 2020-08-09 DIAGNOSIS — G6181 Chronic inflammatory demyelinating polyneuritis: Secondary | ICD-10-CM | POA: Diagnosis not present

## 2020-09-02 DIAGNOSIS — G629 Polyneuropathy, unspecified: Secondary | ICD-10-CM | POA: Diagnosis not present

## 2020-09-23 DIAGNOSIS — G629 Polyneuropathy, unspecified: Secondary | ICD-10-CM | POA: Diagnosis not present

## 2020-10-14 DIAGNOSIS — G629 Polyneuropathy, unspecified: Secondary | ICD-10-CM | POA: Diagnosis not present

## 2020-11-04 DIAGNOSIS — G629 Polyneuropathy, unspecified: Secondary | ICD-10-CM | POA: Diagnosis not present

## 2020-11-25 DIAGNOSIS — G629 Polyneuropathy, unspecified: Secondary | ICD-10-CM | POA: Diagnosis not present

## 2020-12-16 DIAGNOSIS — G629 Polyneuropathy, unspecified: Secondary | ICD-10-CM | POA: Diagnosis not present

## 2021-01-06 DIAGNOSIS — G629 Polyneuropathy, unspecified: Secondary | ICD-10-CM | POA: Diagnosis not present

## 2021-01-24 DIAGNOSIS — G6181 Chronic inflammatory demyelinating polyneuritis: Secondary | ICD-10-CM | POA: Diagnosis not present

## 2021-01-27 DIAGNOSIS — G629 Polyneuropathy, unspecified: Secondary | ICD-10-CM | POA: Diagnosis not present

## 2021-02-17 DIAGNOSIS — G629 Polyneuropathy, unspecified: Secondary | ICD-10-CM | POA: Diagnosis not present

## 2021-02-17 DIAGNOSIS — G6181 Chronic inflammatory demyelinating polyneuritis: Secondary | ICD-10-CM | POA: Diagnosis not present

## 2021-03-17 DIAGNOSIS — G629 Polyneuropathy, unspecified: Secondary | ICD-10-CM | POA: Diagnosis not present

## 2021-04-07 DIAGNOSIS — G629 Polyneuropathy, unspecified: Secondary | ICD-10-CM | POA: Diagnosis not present

## 2021-06-20 DIAGNOSIS — M1990 Unspecified osteoarthritis, unspecified site: Secondary | ICD-10-CM | POA: Diagnosis not present

## 2021-06-20 DIAGNOSIS — E785 Hyperlipidemia, unspecified: Secondary | ICD-10-CM | POA: Diagnosis not present

## 2021-06-20 DIAGNOSIS — R42 Dizziness and giddiness: Secondary | ICD-10-CM | POA: Diagnosis not present

## 2021-06-20 DIAGNOSIS — R269 Unspecified abnormalities of gait and mobility: Secondary | ICD-10-CM | POA: Diagnosis not present

## 2021-06-20 DIAGNOSIS — N3941 Urge incontinence: Secondary | ICD-10-CM | POA: Diagnosis not present

## 2021-06-20 DIAGNOSIS — G8929 Other chronic pain: Secondary | ICD-10-CM | POA: Diagnosis not present

## 2021-06-20 DIAGNOSIS — I10 Essential (primary) hypertension: Secondary | ICD-10-CM | POA: Diagnosis not present

## 2021-06-20 DIAGNOSIS — N529 Male erectile dysfunction, unspecified: Secondary | ICD-10-CM | POA: Diagnosis not present

## 2021-06-20 DIAGNOSIS — K219 Gastro-esophageal reflux disease without esophagitis: Secondary | ICD-10-CM | POA: Diagnosis not present

## 2023-01-30 ENCOUNTER — Inpatient Hospital Stay (HOSPITAL_COMMUNITY): Payer: Medicare PPO

## 2023-01-30 ENCOUNTER — Encounter (HOSPITAL_COMMUNITY): Payer: Self-pay | Admitting: Pharmacy Technician

## 2023-01-30 ENCOUNTER — Other Ambulatory Visit: Payer: Self-pay

## 2023-01-30 ENCOUNTER — Inpatient Hospital Stay (HOSPITAL_COMMUNITY)
Admission: EM | Admit: 2023-01-30 | Discharge: 2023-02-04 | DRG: 871 | Disposition: A | Discharge status: Home / Self Care | Payer: Medicare PPO | Attending: Family Medicine | Admitting: Family Medicine

## 2023-01-30 ENCOUNTER — Emergency Department (HOSPITAL_COMMUNITY): Payer: Medicare PPO

## 2023-01-30 DIAGNOSIS — N3941 Urge incontinence: Secondary | ICD-10-CM | POA: Diagnosis present

## 2023-01-30 DIAGNOSIS — G6181 Chronic inflammatory demyelinating polyneuritis: Secondary | ICD-10-CM | POA: Insufficient documentation

## 2023-01-30 DIAGNOSIS — Z87440 Personal history of urinary (tract) infections: Secondary | ICD-10-CM

## 2023-01-30 DIAGNOSIS — E782 Mixed hyperlipidemia: Secondary | ICD-10-CM | POA: Diagnosis present

## 2023-01-30 DIAGNOSIS — Z720 Tobacco use: Secondary | ICD-10-CM

## 2023-01-30 DIAGNOSIS — A4151 Sepsis due to Escherichia coli [E. coli]: Principal | ICD-10-CM | POA: Diagnosis present

## 2023-01-30 DIAGNOSIS — R0602 Shortness of breath: Secondary | ICD-10-CM | POA: Diagnosis not present

## 2023-01-30 DIAGNOSIS — I129 Hypertensive chronic kidney disease with stage 1 through stage 4 chronic kidney disease, or unspecified chronic kidney disease: Secondary | ICD-10-CM | POA: Diagnosis present

## 2023-01-30 DIAGNOSIS — I10 Essential (primary) hypertension: Secondary | ICD-10-CM | POA: Diagnosis not present

## 2023-01-30 DIAGNOSIS — Z886 Allergy status to analgesic agent status: Secondary | ICD-10-CM | POA: Diagnosis not present

## 2023-01-30 DIAGNOSIS — N179 Acute kidney failure, unspecified: Secondary | ICD-10-CM | POA: Diagnosis not present

## 2023-01-30 DIAGNOSIS — Z85528 Personal history of other malignant neoplasm of kidney: Secondary | ICD-10-CM | POA: Diagnosis not present

## 2023-01-30 DIAGNOSIS — Z8249 Family history of ischemic heart disease and other diseases of the circulatory system: Secondary | ICD-10-CM

## 2023-01-30 DIAGNOSIS — E876 Hypokalemia: Secondary | ICD-10-CM | POA: Diagnosis present

## 2023-01-30 DIAGNOSIS — N183 Chronic kidney disease, stage 3 unspecified: Secondary | ICD-10-CM | POA: Diagnosis not present

## 2023-01-30 DIAGNOSIS — J9601 Acute respiratory failure with hypoxia: Secondary | ICD-10-CM | POA: Diagnosis not present

## 2023-01-30 DIAGNOSIS — A419 Sepsis, unspecified organism: Principal | ICD-10-CM | POA: Diagnosis present

## 2023-01-30 DIAGNOSIS — Z833 Family history of diabetes mellitus: Secondary | ICD-10-CM

## 2023-01-30 DIAGNOSIS — Z79899 Other long term (current) drug therapy: Secondary | ICD-10-CM | POA: Diagnosis not present

## 2023-01-30 DIAGNOSIS — R0609 Other forms of dyspnea: Secondary | ICD-10-CM | POA: Diagnosis not present

## 2023-01-30 DIAGNOSIS — Z1152 Encounter for screening for COVID-19: Secondary | ICD-10-CM

## 2023-01-30 DIAGNOSIS — N390 Urinary tract infection, site not specified: Secondary | ICD-10-CM | POA: Diagnosis present

## 2023-01-30 DIAGNOSIS — Z8546 Personal history of malignant neoplasm of prostate: Secondary | ICD-10-CM

## 2023-01-30 DIAGNOSIS — K219 Gastro-esophageal reflux disease without esophagitis: Secondary | ICD-10-CM | POA: Diagnosis present

## 2023-01-30 DIAGNOSIS — R0989 Other specified symptoms and signs involving the circulatory and respiratory systems: Secondary | ICD-10-CM | POA: Diagnosis not present

## 2023-01-30 DIAGNOSIS — J449 Chronic obstructive pulmonary disease, unspecified: Secondary | ICD-10-CM | POA: Diagnosis present

## 2023-01-30 DIAGNOSIS — E8809 Other disorders of plasma-protein metabolism, not elsewhere classified: Secondary | ICD-10-CM | POA: Diagnosis not present

## 2023-01-30 DIAGNOSIS — I700 Atherosclerosis of aorta: Secondary | ICD-10-CM | POA: Diagnosis not present

## 2023-01-30 DIAGNOSIS — D631 Anemia in chronic kidney disease: Secondary | ICD-10-CM | POA: Diagnosis present

## 2023-01-30 DIAGNOSIS — J410 Simple chronic bronchitis: Secondary | ICD-10-CM

## 2023-01-30 DIAGNOSIS — F17210 Nicotine dependence, cigarettes, uncomplicated: Secondary | ICD-10-CM | POA: Diagnosis present

## 2023-01-30 DIAGNOSIS — R269 Unspecified abnormalities of gait and mobility: Secondary | ICD-10-CM | POA: Diagnosis present

## 2023-01-30 DIAGNOSIS — R509 Fever, unspecified: Secondary | ICD-10-CM | POA: Diagnosis not present

## 2023-01-30 DIAGNOSIS — Z87442 Personal history of urinary calculi: Secondary | ICD-10-CM

## 2023-01-30 DIAGNOSIS — Z9079 Acquired absence of other genital organ(s): Secondary | ICD-10-CM

## 2023-01-30 DIAGNOSIS — R000 Tachycardia, unspecified: Secondary | ICD-10-CM | POA: Diagnosis not present

## 2023-01-30 DIAGNOSIS — D509 Iron deficiency anemia, unspecified: Secondary | ICD-10-CM | POA: Diagnosis present

## 2023-01-30 DIAGNOSIS — I6523 Occlusion and stenosis of bilateral carotid arteries: Secondary | ICD-10-CM | POA: Diagnosis present

## 2023-01-30 DIAGNOSIS — E46 Unspecified protein-calorie malnutrition: Secondary | ICD-10-CM

## 2023-01-30 LAB — COMPREHENSIVE METABOLIC PANEL
ALT: 16 U/L (ref 0–44)
AST: 25 U/L (ref 15–41)
Albumin: 3.3 g/dL — ABNORMAL LOW (ref 3.5–5.0)
Alkaline Phosphatase: 67 U/L (ref 38–126)
Anion gap: 10 (ref 5–15)
BUN: 16 mg/dL (ref 8–23)
CO2: 19 mmol/L — ABNORMAL LOW (ref 22–32)
Calcium: 9.2 mg/dL (ref 8.9–10.3)
Chloride: 106 mmol/L (ref 98–111)
Creatinine, Ser: 1.82 mg/dL — ABNORMAL HIGH (ref 0.61–1.24)
GFR, Estimated: 40 mL/min — ABNORMAL LOW (ref >60)
Glucose, Bld: 132 mg/dL — ABNORMAL HIGH (ref 70–99)
Potassium: 3.4 mmol/L — ABNORMAL LOW (ref 3.5–5.1)
Sodium: 135 mmol/L (ref 135–145)
Total Bilirubin: 0.9 mg/dL (ref 0.3–1.2)
Total Protein: 7.7 g/dL (ref 6.5–8.1)

## 2023-01-30 LAB — URINALYSIS, ROUTINE W REFLEX MICROSCOPIC
Bilirubin Urine: NEGATIVE
Glucose, UA: NEGATIVE mg/dL
Ketones, ur: NEGATIVE mg/dL
Nitrite: POSITIVE — AB
Protein, ur: 100 mg/dL — AB
Specific Gravity, Urine: 1.01 (ref 1.005–1.030)
WBC, UA: >50 WBC/hpf (ref 0–5)
pH: 5 (ref 5.0–8.0)

## 2023-01-30 LAB — LACTIC ACID, PLASMA
Lactic Acid, Venous: 0.9 mmol/L (ref 0.5–1.9)
Lactic Acid, Venous: 1 mmol/L (ref 0.5–1.9)

## 2023-01-30 LAB — CBC WITH DIFFERENTIAL/PLATELET
Abs Immature Granulocytes: 0.08 10*3/uL — ABNORMAL HIGH (ref 0.00–0.07)
Basophils Absolute: 0.1 10*3/uL (ref 0.0–0.1)
Basophils Relative: 0 %
Eosinophils Absolute: 0.5 10*3/uL (ref 0.0–0.5)
Eosinophils Relative: 2 %
HCT: 32.2 % — ABNORMAL LOW (ref 39.0–52.0)
Hemoglobin: 11.1 g/dL — ABNORMAL LOW (ref 13.0–17.0)
Immature Granulocytes: 0 %
Lymphocytes Relative: 4 %
Lymphs Abs: 0.8 10*3/uL (ref 0.7–4.0)
MCH: 26.9 pg (ref 26.0–34.0)
MCHC: 34.5 g/dL (ref 30.0–36.0)
MCV: 78.2 fL — ABNORMAL LOW (ref 80.0–100.0)
Monocytes Absolute: 1.6 10*3/uL — ABNORMAL HIGH (ref 0.1–1.0)
Monocytes Relative: 9 %
Neutro Abs: 16 10*3/uL — ABNORMAL HIGH (ref 1.7–7.7)
Neutrophils Relative %: 85 %
Platelets: 161 10*3/uL (ref 150–400)
RBC: 4.12 MIL/uL — ABNORMAL LOW (ref 4.22–5.81)
RDW: 14.1 % (ref 11.5–15.5)
WBC: 19 10*3/uL — ABNORMAL HIGH (ref 4.0–10.5)
nRBC: 0 % (ref 0.0–0.2)

## 2023-01-30 LAB — MRSA NEXT GEN BY PCR, NASAL: MRSA by PCR Next Gen: NOT DETECTED

## 2023-01-30 LAB — PROTIME-INR
INR: 1.1 (ref 0.8–1.2)
Prothrombin Time: 14.4 seconds (ref 11.4–15.2)

## 2023-01-30 LAB — APTT: aPTT: 32 seconds (ref 24–36)

## 2023-01-30 LAB — SARS CORONAVIRUS 2 BY RT PCR: SARS Coronavirus 2 by RT PCR: NEGATIVE

## 2023-01-30 MED ORDER — ENSURE ENLIVE PO LIQD
237.0000 mL | Freq: Two times a day (BID) | ORAL | Status: DC
  Administered 2023-01-31 – 2023-02-01 (×2): 237 mL via ORAL

## 2023-01-30 MED ORDER — LACTATED RINGERS IV BOLUS (SEPSIS)
1000.0000 mL | Freq: Once | INTRAVENOUS | Status: AC
  Administered 2023-01-30: 1000 mL via INTRAVENOUS

## 2023-01-30 MED ORDER — CHLORHEXIDINE GLUCONATE CLOTH 2 % EX PADS
6.0000 | MEDICATED_PAD | Freq: Every day | CUTANEOUS | Status: DC
  Administered 2023-01-30 – 2023-02-04 (×6): 6 via TOPICAL

## 2023-01-30 MED ORDER — SODIUM CHLORIDE 0.9 % IV SOLN
2.0000 g | INTRAVENOUS | Status: DC
  Administered 2023-01-30 – 2023-02-03 (×5): 2 g via INTRAVENOUS
  Filled 2023-01-30 (×5): qty 20

## 2023-01-30 MED ORDER — LACTATED RINGERS IV BOLUS (SEPSIS): 1000.0000 mL | Freq: Once | INTRAVENOUS | Status: DC

## 2023-01-30 MED ORDER — ACETAMINOPHEN 500 MG PO TABS
1000.0000 mg | ORAL_TABLET | Freq: Once | ORAL | Status: AC
  Administered 2023-01-30: 1000 mg via ORAL
  Filled 2023-01-30: qty 2

## 2023-01-30 MED ORDER — ONDANSETRON HCL 4 MG PO TABS: 4.0000 mg | ORAL_TABLET | Freq: Four times a day (QID) | ORAL | Status: DC | PRN

## 2023-01-30 MED ORDER — LACTATED RINGERS IV SOLN: INTRAVENOUS | Status: AC

## 2023-01-30 MED ORDER — ACETAMINOPHEN 325 MG PO TABS
650.0000 mg | ORAL_TABLET | Freq: Four times a day (QID) | ORAL | Status: DC | PRN
  Administered 2023-01-31 – 2023-02-02 (×6): 650 mg via ORAL
  Filled 2023-01-30 (×5): qty 2

## 2023-01-30 MED ORDER — POTASSIUM CHLORIDE CRYS ER 20 MEQ PO TBCR
40.0000 meq | EXTENDED_RELEASE_TABLET | Freq: Once | ORAL | Status: AC
  Administered 2023-01-30: 40 meq via ORAL
  Filled 2023-01-30: qty 2

## 2023-01-30 MED ORDER — GABAPENTIN 300 MG PO CAPS
900.0000 mg | ORAL_CAPSULE | Freq: Every day | ORAL | Status: DC
  Administered 2023-01-31 – 2023-02-04 (×5): 900 mg via ORAL
  Filled 2023-01-30 (×5): qty 3

## 2023-01-30 MED ORDER — GABAPENTIN 300 MG PO CAPS: 300.0000 mg | ORAL_CAPSULE | ORAL | Status: DC

## 2023-01-30 MED ORDER — AMLODIPINE BESYLATE 5 MG PO TABS
10.0000 mg | ORAL_TABLET | Freq: Every day | ORAL | Status: DC
  Administered 2023-01-31 – 2023-02-04 (×5): 10 mg via ORAL
  Filled 2023-01-30 (×5): qty 2

## 2023-01-30 MED ORDER — PANTOPRAZOLE SODIUM 40 MG PO TBEC
40.0000 mg | DELAYED_RELEASE_TABLET | Freq: Every day | ORAL | Status: DC
  Administered 2023-01-31 – 2023-02-04 (×5): 40 mg via ORAL
  Filled 2023-01-30 (×5): qty 1

## 2023-01-30 MED ORDER — ACETAMINOPHEN 325 MG PO TABS
650.0000 mg | ORAL_TABLET | Freq: Once | ORAL | Status: AC | PRN
  Administered 2023-01-30: 650 mg via ORAL
  Filled 2023-01-30: qty 2

## 2023-01-30 MED ORDER — ACETAMINOPHEN 650 MG RE SUPP: 650.0000 mg | Freq: Four times a day (QID) | RECTAL | Status: DC | PRN

## 2023-01-30 MED ORDER — OXYBUTYNIN CHLORIDE ER 5 MG PO TB24
10.0000 mg | ORAL_TABLET | Freq: Every day | ORAL | Status: DC
  Administered 2023-01-31 – 2023-02-04 (×5): 10 mg via ORAL
  Filled 2023-01-30 (×5): qty 2

## 2023-01-30 MED ORDER — PRAVASTATIN SODIUM 10 MG PO TABS
10.0000 mg | ORAL_TABLET | Freq: Every day | ORAL | Status: DC
  Administered 2023-01-31 – 2023-02-04 (×5): 10 mg via ORAL
  Filled 2023-01-30 (×5): qty 1

## 2023-01-30 MED ORDER — ENOXAPARIN SODIUM 40 MG/0.4ML IJ SOSY
40.0000 mg | PREFILLED_SYRINGE | INTRAMUSCULAR | Status: DC
  Administered 2023-01-31 – 2023-02-04 (×5): 40 mg via SUBCUTANEOUS
  Filled 2023-01-30 (×5): qty 0.4

## 2023-01-30 MED ORDER — GABAPENTIN 300 MG PO CAPS
600.0000 mg | ORAL_CAPSULE | Freq: Every morning | ORAL | Status: DC
  Administered 2023-01-31 – 2023-02-04 (×5): 600 mg via ORAL
  Filled 2023-01-30 (×6): qty 2

## 2023-01-30 MED ORDER — GABAPENTIN 400 MG PO CAPS
1500.0000 mg | ORAL_CAPSULE | Freq: Every day | ORAL | Status: DC
  Administered 2023-01-30 – 2023-02-03 (×5): 1500 mg via ORAL
  Filled 2023-01-30 (×5): qty 1

## 2023-01-30 MED ORDER — ONDANSETRON HCL 4 MG/2ML IJ SOLN: 4.0000 mg | Freq: Four times a day (QID) | INTRAMUSCULAR | Status: DC | PRN

## 2023-01-30 MED ORDER — BACLOFEN 10 MG PO TABS
20.0000 mg | ORAL_TABLET | Freq: Two times a day (BID) | ORAL | Status: DC
  Administered 2023-01-30 – 2023-02-04 (×10): 20 mg via ORAL
  Filled 2023-01-30 (×10): qty 2

## 2023-01-30 NOTE — H&P (Signed)
History and Physical    Patient: Perry [name] QMV:[id number] DOB: 01/25/1956 DOA: 01/30/2023 DOS: the patient was seen and examined on 01/30/2023 PCP: Benjiman Core, MD  Patient coming from: Home  Chief Complaint:  Chief Complaint  Patient presents with   Fever   HPI: Perry [name] is a 67 y.o. 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  Fever at home 100.5 F,he complained of increased urinary frequency, but denies flank pain, chest pain, shortness of breath, abdominal pain, nausea or vomiting.  ED Course:  In emergency department, he was febrile with a temperature of 103.25F, pulse 101 bpm, BP 126/58, respiratory rate 20/min, O2 sat 94% on room air.  Shortly after arrival to the ED, Patient's O2 sats decreased to high 80s and was provided with supplemental oxygen at 3 LPM.  Patient does not use oxygen at baseline.  Workup in the ED showed WBC 19.0, hemoglobin 11.1, hematocrit 32.2, MCV 13.2, platelets 161, BMP showed sodium 135, potassium 3.4, chloride 106, bicarb 19, glucose 132, BUN 16, creatinine 1.82 (baseline creatinine is 0.9/1.0), albumin 3.3, lactic acid x 2 was normal, urinalysis was positive for UTI.  Blood culture and urine culture pending, SARS contrast was negative.  Chest x-ray showed no active disease. Chest x-ray showed no active disease Patient was treated with Tylenol, IV ceftriaxone was given and IV hydration was provided.  Hospitalist was asked to admit patient for further evaluation and management.  Review of Systems: Review of systems as noted in the HPI. All other systems reviewed and are negative.   Past Medical History:  Diagnosis Date   Abnormal gait    uses two sticks to walk   Bilateral carotid artery stenosis    20-39%    Claudication, intermittent (HCC)    Complication of anesthesia    "hard to wake and confused"   GERD (gastroesophageal reflux disease)    Hiatal hernia    History of prostate cancer    08-07-2010  s/p  radical prostatectomy by dr Laverle Patter   Hyperlipidemia    Hypertension    Lower urinary tract symptoms (LUTS)    Neuropathy, peripheral    Retained ureteral stent    right side   Right renal mass    09-22-2015 per MRI consistant with renal cell carcinoma   Right ureteral stone    Wears glasses    Past Surgical History:  Procedure Laterality Date   COLONOSCOPY  04/25/2007   CYSTOSCOPY W/ URETERAL STENT PLACEMENT Right 01/12/2016   Procedure: CYSTOSCOPY WITH RETROGRADE PYELOGRAM/URETERAL STENT PLACEMENT;  Surgeon: Ihor Gully, MD;  Location: [facility];  Service: Urology;  Laterality: Right;   CYSTOSCOPY/URETEROSCOPY/HOLMIUM LASER/STENT PLACEMENT Right 02/20/2016   Procedure: CYSTOSCOPY/URETEROSCOPY/HOLMIUM LASER/STENT PLACEMENT AND REMOVAL OF STENT;  Surgeon: Ihor Gully, MD;  Location: [facility];  Service: Urology;  Laterality: Right;   ROBOT ASSISTED LAPAROSCOPIC RADICAL PROSTATECTOMY  08-07-2010  dr Laverle Patter    Social History:  reports that he has been smoking cigarettes. He has a 45 pack-year smoking history. He has never used smokeless tobacco. He reports that he does not drink alcohol and does not use drugs.   Allergies  Allergen Reactions   Aspirin Other (See Comments)    Unsure reaction    Family History  Problem Relation Age of Onset   Diabetes Mother    Hypertension Mother  Hypertension Unknown        family history      Prior to Admission medications   Medication Sig Start Date End Date Taking? Authorizing Provider  amLODipine (NORVASC) 10 MG tablet Take 1 tablet (10 mg total) by mouth daily. Patient taking differently: Take 10 mg by mouth every morning. 01/16/16  Yes Jeralyn Bennett, MD  baclofen (LIORESAL) 10 MG tablet Take 20 mg by mouth 2 (two)  times daily. 05/29/22  Yes [provider]  Calcium Citrate-Vitamin D 315-5 MG-MCG TABS Take 1 tablet by mouth in the morning and at bedtime.   Yes [provider]  Cholecalciferol 25 MCG (1000 UT) tablet Take 1,000 Units by mouth daily. 07/06/21  Yes [provider]  cyanocobalamin 1000 MCG tablet Take 1,000 mcg by mouth daily. 08/12/17  Yes [provider]  gabapentin (NEURONTIN) 300 MG capsule Take 300 mg by mouth See admin instructions. Take 2 capsules by mouth every morning, 3 capsules every day at noon, and 5 capsules at bedtime. 03/02/19  Yes [provider]  Meclizine HCl 25 MG CHEW Chew 1 tablet by mouth every 6 (six) hours as needed (vertigo). 07/03/22  Yes [provider]  niacin (VITAMIN B3) 500 MG tablet Take 500 mg by mouth See admin instructions. Take 500 mg by mouth daily with breakfast and 1000 mg at bedtime. 02/15/12  Yes [provider]  omeprazole (PRILOSEC) 20 MG capsule Take 20 mg by mouth daily. 09/09/22  Yes [provider]  oxybutynin (DITROPAN-XL) 10 MG 24 hr tablet Take 10 mg by mouth daily.   Yes [provider]  pravastatin (PRAVACHOL) 10 MG tablet Take 10 mg by mouth daily.   Yes [provider]  promethazine (PHENERGAN) 25 MG tablet Take 1 tablet (25 mg total) by mouth every 6 (six) hours as needed. 05/07/16  Yes Vanetta Mulders, MD    Physical Exam: BP 132/65 (BP Location: Right Arm)   Pulse 84   Temp 99.4 F (37.4 C) (Oral)   Resp (!) 21   Ht 5\' 9"  (1.753 m)   Wt 88.6 kg   SpO2 95%   BMI 28.84 kg/m   General: 67 y.o. year-old male well developed well nourished in no acute distress.  Alert and oriented x3. HEENT: NCAT, EOMI Neck: Supple, trachea medial Cardiovascular: Regular rate and rhythm with no rubs or gallops.  No thyromegaly or JVD noted.  No lower extremity edema. 2/4 pulses in all 4 extremities. Respiratory: Clear to auscultation with no wheezes or rales. Good  inspiratory effort. Abdomen: Soft, nontender nondistended with normal bowel sounds x4 quadrants. Muskuloskeletal: No cyanosis, clubbing or edema noted bilaterally Neuro: CN II-XII intact, strength 5/5 x 4, sensation, reflexes intact Skin: No ulcerative lesions noted or rashes Psychiatry: Judgement and insight appear normal. Mood is appropriate for condition and setting          Labs on Admission:  Basic Metabolic Panel: Recent Labs  Lab 01/30/23 1417  NA 135  K 3.4*  CL 106  CO2 19*  GLUCOSE 132*  BUN 16  CREATININE 1.82*  CALCIUM 9.2   Liver Function Tests: Recent Labs  Lab 01/30/23 1417  AST 25  ALT 16  ALKPHOS 67  BILITOT 0.9  PROT 7.7  ALBUMIN 3.3*   No results for input(s): "LIPASE", "AMYLASE" in the last 168 hours. No results for input(s): "AMMONIA" in the last 168 hours. CBC: Recent Labs  Lab 01/30/23 1417  WBC 19.0*  NEUTROABS 16.0*  HGB  11.1*  HCT 32.2*  MCV 78.2*  PLT 161   Cardiac Enzymes: No results for input(s): "CKTOTAL", "CKMB", "CKMBINDEX", "TROPONINI" in the last 168 hours.  BNP (last 3 results) No results for input(s): "BNP" in the last 8760 hours.  ProBNP (last 3 results) No results for input(s): "PROBNP" in the last 8760 hours.  CBG: No results for input(s): "GLUCAP" in the last 168 hours.  Radiological Exams on Admission: DG Chest 1 View  Result Date: 01/30/2023 CLINICAL DATA:  Shortness of breath EXAM: CHEST  1 VIEW COMPARISON:  Radiograph 01/30/2023 FINDINGS: Stable cardiomediastinal silhouette. Aortic atherosclerotic calcification. No focal consolidation, pleural effusion, or pneumothorax. No displaced rib fractures. IMPRESSION: No acute cardiopulmonary disease. Electronically Signed   By: Minerva Fester M.D.   On: 01/30/2023 21:41   DG Chest Portable 1 View  Result Date: 01/30/2023 CLINICAL DATA:  Fever. EXAM: PORTABLE CHEST 1 VIEW COMPARISON:  January 12, 2016. FINDINGS: The heart size and mediastinal contours are within  normal limits. Both lungs are clear. The visualized skeletal structures are unremarkable. IMPRESSION: No active disease. Electronically Signed   By: Lupita Raider M.D.   On: 01/30/2023 15:35    EKG: I independently viewed the EKG done and my findings are as followed: Normal sinus rhythm at a rate of 76 bpm  Assessment/Plan Present on Admission:  Sepsis due to urinary tract infection (HCC)  Hypokalemia  Mixed hyperlipidemia  Principal Problem:   Sepsis due to urinary tract infection (HCC) Active Problems:   Mixed hyperlipidemia   Hypokalemia   Acute respiratory failure with hypoxia (HCC)   Hypoalbuminemia   AKI (acute kidney injury) (HCC)   Essential hypertension   GERD (gastroesophageal reflux disease)   Chronic inflammatory demyelinating polyneuropathy (HCC)   Urge incontinence   Sepsis secondary to UTI POA Patient met sepsis criteria due to being febrile, tachycardic (met SIRS criteria) and with source of infection being UTI (met sepsis criteria) Patient was started on IV ceftriaxone, we shall continue with this at this time Urine culture on 01/12/2016 was positive for E. coli and this was sensitive to ceftriaxone Continue on Tylenol as needed Continue IV hydration Urine culture pending  Acute respiratory failure with hypoxia It is currently unknown at this time why patient was hypoxic Chest x-ray showed no active disease Continue supplemental oxygen to maintain O2 sat of >92% with plan to wean patient off days as tolerated (patient does not use oxygen at baseline).  Acute kidney injury Creatinine 1.82 (baseline creatinine is 0.9/1.0) Continue IV hydration Renally adjust medications, avoid nephrotoxic agents/dehydration/hypotension  Hypokalemia K+ 3.4, this will be replenished  Hypoalbuminemia Albumin 3.3 Protein supplement will be provided  Microcytic anemia Hemoglobin 11.1, hematocrit 32.2, MCV 78.2 Iron workup will be done  Essential hypertension Continue  amlodipine  GERD Continue Protonix  Mixed hyperlipidemia Continue pravastatin  Urge incontinence Continue oxybutynin  Chronic inflammatory demyelinating polyneuropathy Continue Neurontin, baclofen   DVT prophylaxis: SCDs  Advance Care Planning: Full Code  Consults: None  Family Communication: None at bedside  Severity of Illness: The appropriate patient status for this patient is INPATIENT. Inpatient status is judged to be reasonable and necessary in order to provide the required intensity of service to ensure the patient's safety. The patient's presenting symptoms, physical exam findings, and initial radiographic and laboratory data in the context of their chronic comorbidities is felt to place them at high risk for further clinical deterioration. Furthermore, it is not anticipated that the patient will be medically stable for discharge  from the hospital within 2 midnights of admission.   * I certify that at the point of admission it is my clinical judgment that the patient will require inpatient hospital care spanning beyond 2 midnights from the point of admission due to high intensity of service, high risk for further deterioration and high frequency of surveillance required.*  Author: Frankey Shown, DO 01/30/2023 9:54 PM  For on call review [email or web address].

## 2023-01-30 NOTE — ED Notes (Signed)
This RN walked into room and found pt shivering, HR 150, RR in the 30s and pt complaining that he was cold. Pt temp taken and it was 99.6 orally, then taken rectally and was 102. This RN notified PA and PA in to assess pt. Tylenol ordered for pt.Pt back on 2L O2 and changed and rearranged in bed.

## 2023-01-30 NOTE — Sepsis Progress Note (Signed)
Code Sepsis protocol being monitored by eLink. 

## 2023-01-30 NOTE — ED Notes (Addendum)
Pt can stand with assistance; However he does use an Mining engineer wheelchair. When asked if he can move/use his legs, pt states "with crutches". However, pt cannot stand up without assistance at this time. Hx of "spastic gait"

## 2023-01-30 NOTE — ED Notes (Addendum)
Weaned pt off of oxygen from 3 L to 2 L [place] and then RA. SpO2 dropped to low 90s and then 86-88% RA--PA-C made aware

## 2023-01-30 NOTE — ED Notes (Signed)
Both sets of blood cultures drawn before any antibiotic administration  

## 2023-01-30 NOTE — ED Notes (Signed)
Pt has an eletronic wheelchair with him, currently at bedside with him. Pt resting in ER bed

## 2023-01-30 NOTE — ED Provider Notes (Signed)
[place] EMERGENCY DEPARTMENT AT [facility] Provider Note   CSN: [id number] Arrival date & time: 01/30/23  1237     History  No chief complaint on file.   Perry [name] is a 67 y.o. male.  HPI     Perry [name] is a 67 y.o. male past medical history of frequent UTIs, prostate cancer, hypertension, and chronic inflammatory demyelinating polyneuropathy he is in a motorized scooter at baseline.  he presents to the Emergency Department complaining of fever, chills, generalized bodyaches.  Symptoms began yesterday.  He states that he has been urinating frequently describes multiple times per day usually several times per hour.  He denies any flank pain, abdominal pain, chest pain or shortness of breath.  No bloody urine no nausea or vomiting.  Home Medications Prior to Admission medications   Medication Sig Start Date End Date Taking? Authorizing Provider  amLODipine (NORVASC) 10 MG tablet Take 1 tablet (10 mg total) by mouth daily. Patient taking differently: Take 10 mg by mouth every morning.  01/16/16   Jeralyn Bennett, MD  HYDROcodone-acetaminophen [facility]) 10-325 MG tablet Take 1-2 tablets by mouth every 4 (four) hours as needed for moderate pain. Maximum dose per 24 hours - 8 pills Patient not taking: Reported on 05/07/2016 02/20/16   Ihor Gully, MD  HYDROcodone-acetaminophen (NORCO/VICODIN) 5-325 MG tablet Take 1-2 tablets by mouth every 6 (six) hours as needed. 05/07/16   Vanetta Mulders, MD  isosorbide mononitrate (IMDUR) 30 MG 24 hr tablet Take 1 tablet (30 mg total) by mouth daily. Patient taking differently: Take 30 mg by mouth every morning.  01/16/16   Jeralyn Bennett, MD  loperamide (IMODIUM A-D) 2 MG tablet Take 1 tablet (2 mg total) by mouth 4 (four) times daily as needed for diarrhea or loose stools. 05/07/16   Vanetta Mulders, MD  phenazopyridine (PYRIDIUM) 200 MG tablet Take 1 tablet (200 mg total) by mouth 3 (three) times daily as needed for pain. Patient  not taking: Reported on 05/07/2016 02/20/16   Ihor Gully, MD  promethazine (PHENERGAN) 25 MG tablet Take 1 tablet (25 mg total) by mouth every 6 (six) hours as needed. 05/07/16   Vanetta Mulders, MD      Allergies    Aspirin    Review of Systems   Review of Systems  Constitutional:  Positive for chills and fever.  HENT:  Negative for sore throat.   Respiratory:  Negative for cough and shortness of breath.   Cardiovascular:  Negative for chest pain.  Gastrointestinal:  Negative for abdominal pain, nausea and vomiting.  Genitourinary:  Positive for frequency. Negative for difficulty urinating, flank pain and hematuria.  Musculoskeletal:  Negative for back pain.  Skin:  Negative for rash.  Neurological:  Negative for dizziness, seizures, weakness and headaches.    Physical Exam Updated Vital Signs BP (!) 126/58 (BP Location: Right Arm)   Pulse (!) 101   Temp 99.8 F (37.7 C) (Oral)   Resp 20   SpO2 94%  Physical Exam Vitals and nursing note reviewed.  Constitutional:      General: He is not in acute distress.    Appearance: Normal appearance. He is not ill-appearing or toxic-appearing.  HENT:     Mouth/Throat:     Mouth: Mucous membranes are moist.  Eyes:     Extraocular Movements: Extraocular movements intact.     Conjunctiva/sclera: Conjunctivae normal.     Pupils: Pupils are equal, round, and reactive to light.  Cardiovascular:  Rate and Rhythm: Normal rate and regular rhythm.     Pulses: Normal pulses.  Pulmonary:     Effort: Pulmonary effort is normal.  Abdominal:     Palpations: Abdomen is soft.     Tenderness: There is no abdominal tenderness.  Skin:    General: Skin is warm.  Neurological:     General: No focal deficit present.     Mental Status: He is alert. Mental status is at baseline.     Motor: No weakness.     ED Results / Procedures / Treatments   Labs (all labs ordered are listed, but only abnormal results are displayed) Labs Reviewed   CBC WITH DIFFERENTIAL/PLATELET - Abnormal; Notable for the following components:      Result Value   WBC 19.0 (*)    RBC 4.12 (*)    Hemoglobin 11.1 (*)    HCT 32.2 (*)    MCV 78.2 (*)    Neutro Abs 16.0 (*)    Monocytes Absolute 1.6 (*)    Abs Immature Granulocytes 0.08 (*)    All other components within normal limits  COMPREHENSIVE METABOLIC PANEL - Abnormal; Notable for the following components:   Potassium 3.4 (*)    CO2 19 (*)    Glucose, Bld 132 (*)    Creatinine, Ser 1.82 (*)    Albumin 3.3 (*)    GFR, Estimated 40 (*)    All other components within normal limits  SARS CORONAVIRUS 2 BY RT PCR  CULTURE, BLOOD (ROUTINE X 2)  CULTURE, BLOOD (ROUTINE X 2)  URINE CULTURE  LACTIC ACID, PLASMA  LACTIC ACID, PLASMA  URINALYSIS, ROUTINE W REFLEX MICROSCOPIC  PROTIME-INR  APTT    EKG EKG Interpretation Date/Time:  Wednesday January 30 2023 15:15:34 EDT Ventricular Rate:  76 PR Interval:  172 QRS Duration:  95 QT Interval:  374 QTC Calculation: 421 R Axis:   55  Text Interpretation: Sinus rhythm Confirmed by Eber Hong ([id number]) on 01/30/2023 8:18:23 PM  Radiology DG Chest Portable 1 View  Result Date: 01/30/2023 CLINICAL DATA:  Fever. EXAM: PORTABLE CHEST 1 VIEW COMPARISON:  January 12, 2016. FINDINGS: The heart size and mediastinal contours are within normal limits. Both lungs are clear. The visualized skeletal structures are unremarkable. IMPRESSION: No active disease. Electronically Signed   By: Lupita Raider M.D.   On: 01/30/2023 15:35    Procedures Procedures    CRITICAL CARE Performed by: Addisson Frate Total critical care time: 35 minutes Critical care time was exclusive of separately billable procedures and treating other patients. Critical care was necessary to treat or prevent imminent or life-threatening deterioration. Critical care was time spent personally by me on the following activities: development of treatment plan with patient and/or surrogate  as well as nursing, discussions with consultants, evaluation of patient's response to treatment, examination of patient, obtaining history from patient or surrogate, ordering and performing treatments and interventions, ordering and review of laboratory studies, ordering and review of radiographic studies, pulse oximetry and re-evaluation of patient's condition.  Patient febrile on arrival, required labs, IV fluids, IV antibiotics, antipyretics.  Developed hypoxia likely secondary to his fever, was placed on supplemental oxygen  Medications Ordered in ED Medications  lactated ringers infusion (0 mLs Intravenous Stopped 01/30/23 1931)  cefTRIAXone (ROCEPHIN) 2 g in sodium chloride 0.9 % 100 mL IVPB (0 g Intravenous Stopped 01/30/23 1641)  acetaminophen (TYLENOL) tablet 650 mg (650 mg Oral Given 01/30/23 1320)  lactated ringers bolus 1,000 mL (0 mLs  Intravenous Stopped 01/30/23 1714)  acetaminophen (TYLENOL) tablet 1,000 mg (1,000 mg Oral Given 01/30/23 1920)    ED Course/ Medical Decision Making/ A&P                                 Medical Decision Making Patient here from home with complaint of fever, chills, generalized bodyaches.  Symptoms began yesterday.  Denies any abdominal pain chest pain shortness of breath headache dizziness nausea or vomiting he also notes frequent urination   Differential would include but not limited to viral process, COVID, pneumonia, sepsis, UTI.  No flank pain or pain with urination to suggest a pyelonephritis or kidney stone.  Patient febrile on arrival, but looks very well.  Amount and/or Complexity of Data Reviewed Labs: ordered.    Details: Labs interpreted by me, leukocytosis with white count of 19,000, chemistries show mildly elevated serum creatinine at 1.8.  Lactic acid unremarkable.  COVID tested with negative urinalysis shows UTI with positive nitrates large leukocytes many white cells and many bacteria. Radiology: ordered.    Details: Chest x-ray was  ordered, no active cardiopulmonary process. ECG/medicine tests: ordered.    Details: EKG shows sinus rhythm Discussion of management or test interpretation with external provider(s): Patient with urinary frequency fever chills and bodyaches on arrival.  Has history of frequent UTIs.  He has history of chronic inflammatory polyneuropathy, is in a motorized wheelchair at baseline.  Given his presentation with fever and negative COVID, labs were ordered.  Initially did not appear septic.  His lactic acids here have been reassuring.  Urine and blood were cultured.  He was started on IV Rocephin and given IV fluids here.  Fever improved after Tylenol.  During ER course, patient became hypoxic was placed on 2 L O2.  No supplemental oxygen requirement at baseline.  Notified by nursing staff that patient suddenly became hypoxic with sats dropping into the upper 80s and tachycardic.  Rectal temp of 102.  When I reentered the room patient was having rigors.  Was given a gram of Tylenol.  Tachycardia improving.  His rigors are resolving.  Discussed findings with [facility] hospitalist, Dr. Thomes Dinning who is agreeable to admit  Risk OTC drugs.           Final Clinical Impression(s) / ED Diagnoses Final diagnoses:  Sepsis due to urinary tract infection [facility])    Rx / DC Orders ED Discharge Orders     None         Pauline Aus, PA-C 01/30/23 2025    Derwood Kaplan, MD 01/31/23 [phone]

## 2023-01-30 NOTE — ED Notes (Signed)
Pt taken off 2L of oxygen. Instructions from PA.

## 2023-01-30 NOTE — ED Notes (Signed)
Pt initially placed on 2 L [place] due to SpO2 being 87-88% on RA. Pt then bumped up to 3 L [place] due to SpO2 staying 90-91% on 2 L [place]

## 2023-01-30 NOTE — ED Notes (Signed)
Pt able to use urinal for urine sample.

## 2023-01-30 NOTE — ED Triage Notes (Signed)
Pt here with reports of cold chills, fever, generalized body aches since yesterday.

## 2023-01-30 NOTE — ED Notes (Signed)
Attempting second IV line, pt does not appear to tolerate IV access well. One IV access line already established and IV antibiotic running

## 2023-01-31 ENCOUNTER — Other Ambulatory Visit: Payer: Self-pay

## 2023-01-31 DIAGNOSIS — G6181 Chronic inflammatory demyelinating polyneuritis: Secondary | ICD-10-CM | POA: Diagnosis not present

## 2023-01-31 DIAGNOSIS — A419 Sepsis, unspecified organism: Secondary | ICD-10-CM | POA: Diagnosis not present

## 2023-01-31 DIAGNOSIS — N179 Acute kidney failure, unspecified: Secondary | ICD-10-CM | POA: Diagnosis not present

## 2023-01-31 DIAGNOSIS — J9601 Acute respiratory failure with hypoxia: Secondary | ICD-10-CM | POA: Diagnosis not present

## 2023-01-31 LAB — BLOOD CULTURE ID PANEL (REFLEXED) - BCID2

## 2023-01-31 LAB — COMPREHENSIVE METABOLIC PANEL
ALT: 19 U/L (ref 0–44)
AST: 32 U/L (ref 15–41)
Albumin: 3.2 g/dL — ABNORMAL LOW (ref 3.5–5.0)
Alkaline Phosphatase: 67 U/L (ref 38–126)
Anion gap: 10 (ref 5–15)
BUN: 18 mg/dL (ref 8–23)
CO2: 19 mmol/L — ABNORMAL LOW (ref 22–32)
Calcium: 9.1 mg/dL (ref 8.9–10.3)
Chloride: 108 mmol/L (ref 98–111)
Creatinine, Ser: 1.66 mg/dL — ABNORMAL HIGH (ref 0.61–1.24)
GFR, Estimated: 45 mL/min — ABNORMAL LOW (ref >60)
Glucose, Bld: 98 mg/dL (ref 70–99)
Potassium: 3.9 mmol/L (ref 3.5–5.1)
Sodium: 137 mmol/L (ref 135–145)
Total Bilirubin: 0.9 mg/dL (ref 0.3–1.2)
Total Protein: 8 g/dL (ref 6.5–8.1)

## 2023-01-31 LAB — FERRITIN: Ferritin: 338 ng/mL — ABNORMAL HIGH (ref 24–336)

## 2023-01-31 LAB — IRON AND TIBC
Iron: 12 ug/dL — ABNORMAL LOW (ref 45–182)
Saturation Ratios: 6 % — ABNORMAL LOW (ref 17.9–39.5)
TIBC: 217 ug/dL — ABNORMAL LOW (ref 250–450)
UIBC: 205 ug/dL

## 2023-01-31 LAB — CBC
HCT: 32.9 % — ABNORMAL LOW (ref 39.0–52.0)
Hemoglobin: 11.2 g/dL — ABNORMAL LOW (ref 13.0–17.0)
MCH: 26.8 pg (ref 26.0–34.0)
MCHC: 34 g/dL (ref 30.0–36.0)
MCV: 78.7 fL — ABNORMAL LOW (ref 80.0–100.0)
Platelets: 169 10*3/uL (ref 150–400)
RBC: 4.18 MIL/uL — ABNORMAL LOW (ref 4.22–5.81)
RDW: 14.3 % (ref 11.5–15.5)
WBC: 17 10*3/uL — ABNORMAL HIGH (ref 4.0–10.5)
nRBC: 0 % (ref 0.0–0.2)

## 2023-01-31 LAB — HIV ANTIBODY (ROUTINE TESTING W REFLEX): HIV Screen 4th Generation wRfx: NONREACTIVE

## 2023-01-31 LAB — MAGNESIUM: Magnesium: 1.9 mg/dL (ref 1.7–2.4)

## 2023-01-31 LAB — PHOSPHORUS: Phosphorus: 2.1 mg/dL — ABNORMAL LOW (ref 2.5–4.6)

## 2023-01-31 LAB — GLUCOSE, CAPILLARY: Glucose-Capillary: 102 mg/dL — ABNORMAL HIGH (ref 70–99)

## 2023-01-31 NOTE — TOC Initial Note (Signed)
Transition of Care [facility]) - Initial/Assessment Note    Patient Details  Name: Perry [name] MRN: [id number] Date of Birth: 07-Jul-1955  Transition of [facility]) CM/SW Contact:    Villa Herb, LCSWA Phone Number: 01/31/2023, 11:44 AM  Clinical Narrative:                 [facility] consulted for HH/DME needs. CSW spoke with pt at bedside to complete an assessment. Pt states that he does not use O2 at baseline. Pt uses a motorized wheelchair in the community. Pt states he is able to complete his ADLs and has help if he needs to. TOC to follow.   Expected Discharge Plan: Home/Self Care Barriers to Discharge: Continued Medical Work up   Patient Goals and CMS Choice Patient states their goals for this hospitalization and ongoing recovery are:: return home CMS Medicare.gov Compare Post Acute Care list provided to:: Patient Choice offered to / list presented to : Patient      Expected Discharge Plan and Services In-house Referral: Clinical Social Work Discharge Planning Services: CM Consult   Living arrangements for the past 2 months: Single Family Home                                      Prior Living Arrangements/Services Living arrangements for the past 2 months: Single Family Home Lives with:: Self Patient language and need for interpreter reviewed:: Yes Do you feel safe going back to the place where you live?: Yes      Need for Family Participation in Patient Care: Yes (Comment) Care giver support system in place?: Yes (comment) Current home services: DME Criminal Activity/Legal Involvement Pertinent to Current Situation/Hospitalization: No - Comment as needed  Activities of Daily Living Home Assistive Devices/Equipment: Crutches, Electric scooter, Dentures (specify type), Eyeglasses, Shower chair with back, Grab bars around toilet, Grab bars in shower, Wheelchair ADL Screening (condition at time of admission) Patient's cognitive ability adequate to safely complete  daily activities?: Yes Is the patient deaf or have difficulty hearing?: No Does the patient have difficulty seeing, even when wearing glasses/contacts?: No Does the patient have difficulty concentrating, remembering, or making decisions?: No Patient able to express need for assistance with ADLs?: Yes Does the patient have difficulty dressing or bathing?: Yes Independently performs ADLs?: Yes (appropriate for developmental age) Does the patient have difficulty walking or climbing stairs?: Yes Weakness of Legs: Both Weakness of Arms/Hands: None  Permission Sought/Granted                  Emotional Assessment Appearance:: Appears stated age Attitude/Demeanor/Rapport: Engaged Affect (typically observed): Irritable Orientation: : Oriented to Self, Oriented to Place, Oriented to  Time, Oriented to Situation Alcohol / Substance Use: Not Applicable Psych Involvement: No (comment)  Admission diagnosis:  Sepsis due to urinary tract infection (HCC) [A41.9, N39.0] Patient Active Problem List   Diagnosis Date Noted   Sepsis due to urinary tract infection (HCC) 01/30/2023   Acute respiratory failure with hypoxia (HCC) 01/30/2023   Hypoalbuminemia 01/30/2023   AKI (acute kidney injury) (HCC) 01/30/2023   Essential hypertension 01/30/2023   GERD (gastroesophageal reflux disease) 01/30/2023   Chronic inflammatory demyelinating polyneuropathy (HCC) 01/30/2023   Urge incontinence 01/30/2023   Sepsis (HCC) 01/12/2016   Carotid stenosis 01/12/2016   Malignant neoplasm of prostate (HCC) 01/12/2016   Mixed hyperlipidemia 01/12/2016   Right ureteral stone 01/12/2016   Hypokalemia 01/12/2016  Leukocytosis 01/12/2016   UTI (urinary tract infection) 01/12/2016   Renal mass 09/21/2015   Spastic gait 02/15/2012   PCP:  Benjiman Core, MD Pharmacy:   [facility] [address] [place] - [phone] [place] [address] Phone: [phone] Fax:  [phone]     Social Determinants of Health (SDOH) Social History: SDOH Screenings   Food Insecurity: No Food Insecurity (01/30/2023)  Housing: Low Risk  (01/30/2023)  Transportation Needs: No Transportation Needs (01/30/2023)  Utilities: Not At Risk (01/30/2023)  Financial Resource Strain: Low Risk  (07/05/2022)   Received from [facility]  Physical Activity: Insufficiently Active (10/23/2021)   Received from [facility]  Social Connections: Unknown (11/02/2022)   Received from [facility]  Stress: Stress Concern Present (10/23/2021)   Received from [facility]  Tobacco Use: High Risk (01/30/2023)   SDOH Interventions:     Readmission Risk Interventions    01/31/2023   11:43 AM  Readmission Risk Prevention Plan  Transportation Screening Complete  Home Care Screening Complete  Medication Review (RN CM) Complete

## 2023-01-31 NOTE — Progress Notes (Signed)
PHARMACY - PHYSICIAN COMMUNICATION CRITICAL VALUE ALERT - BLOOD CULTURE IDENTIFICATION (BCID)  Perry [name] is an 67 y.o. male who presented to [facility] on 01/30/2023 with a chief complaint of fever  Assessment:  67 y.o. male with medical history significant of hypertension, prostate cancer, chronic inflammatory demyelinating polyneuropathy, frequent UTIs who presents to the emergency department due to fever, chills and bodyaches which started yesterday.  Fever at home 100.5 F,he complained of increased urinary frequency, but denies flank pain. BCx + E.Coli and no resistance  Name of physician (or Provider) Contacted: Dr. Macky Lower  Current antibiotics: Ceftriaxone 2gm IV q24h  Changes to prescribed antibiotics recommended:  Patient is on recommended antibiotics - No changes needed  Results for orders placed or performed during the hospital encounter of 01/30/23  Blood Culture ID Panel (Reflexed) (Collected: 01/30/2023  3:05 PM)  Result Value Ref Range   Enterococcus faecalis NOT DETECTED NOT DETECTED   Enterococcus Faecium NOT DETECTED NOT DETECTED   Listeria monocytogenes NOT DETECTED NOT DETECTED   Staphylococcus species NOT DETECTED NOT DETECTED   Staphylococcus aureus (BCID) NOT DETECTED NOT DETECTED   Staphylococcus epidermidis NOT DETECTED NOT DETECTED   Staphylococcus lugdunensis NOT DETECTED NOT DETECTED   Streptococcus species NOT DETECTED NOT DETECTED   Streptococcus agalactiae NOT DETECTED NOT DETECTED   Streptococcus pneumoniae NOT DETECTED NOT DETECTED   Streptococcus pyogenes NOT DETECTED NOT DETECTED   A.calcoaceticus-baumannii NOT DETECTED NOT DETECTED   Bacteroides fragilis NOT DETECTED NOT DETECTED   Enterobacterales DETECTED (A) NOT DETECTED   Enterobacter cloacae complex NOT DETECTED NOT DETECTED   Escherichia coli DETECTED (A) NOT DETECTED   Klebsiella aerogenes NOT DETECTED NOT DETECTED   Klebsiella oxytoca NOT DETECTED NOT DETECTED   Klebsiella pneumoniae NOT  DETECTED NOT DETECTED   Proteus species NOT DETECTED NOT DETECTED   Salmonella species NOT DETECTED NOT DETECTED   Serratia marcescens NOT DETECTED NOT DETECTED   Haemophilus influenzae NOT DETECTED NOT DETECTED   Neisseria meningitidis NOT DETECTED NOT DETECTED   Pseudomonas aeruginosa NOT DETECTED NOT DETECTED   Stenotrophomonas maltophilia NOT DETECTED NOT DETECTED   Candida albicans NOT DETECTED NOT DETECTED   Candida auris NOT DETECTED NOT DETECTED   Candida glabrata NOT DETECTED NOT DETECTED   Candida krusei NOT DETECTED NOT DETECTED   Candida parapsilosis NOT DETECTED NOT DETECTED   Candida tropicalis NOT DETECTED NOT DETECTED   Cryptococcus neoformans/gattii NOT DETECTED NOT DETECTED   CTX-M ESBL NOT DETECTED NOT DETECTED   Carbapenem resistance IMP NOT DETECTED NOT DETECTED   Carbapenem resistance KPC NOT DETECTED NOT DETECTED   Carbapenem resistance NDM NOT DETECTED NOT DETECTED   Carbapenem resist OXA 48 LIKE NOT DETECTED NOT DETECTED   Carbapenem resistance VIM NOT DETECTED NOT DETECTED    Tera Mater 01/31/2023  10:28 AM

## 2023-01-31 NOTE — Plan of Care (Signed)
  Problem: Education: Goal: Knowledge of General Education information will improve Description: Including pain rating scale, medication(s)/side effects and non-pharmacologic comfort measures 01/31/2023 1630 by Paralee Cancel, RN Outcome: Progressing 01/31/2023 1630 by Paralee Cancel, RN Outcome: Progressing   Problem: Health Behavior/Discharge Planning: Goal: Ability to manage health-related needs will improve 01/31/2023 1630 by Paralee Cancel, RN Outcome: Progressing 01/31/2023 1630 by Paralee Cancel, RN Outcome: Progressing   Problem: Clinical Measurements: Goal: Ability to maintain clinical measurements within normal limits will improve 01/31/2023 1630 by Paralee Cancel, RN Outcome: Not Progressing 01/31/2023 1630 by Paralee Cancel, RN Outcome: Progressing   Problem: Clinical Measurements: Goal: Ability to maintain clinical measurements within normal limits will improve 01/31/2023 1630 by Paralee Cancel, RN Outcome: Not Progressing 01/31/2023 1630 by Paralee Cancel, RN Outcome: Progressing   Problem: Clinical Measurements: Goal: Ability to maintain clinical measurements within normal limits will improve 01/31/2023 1630 by Paralee Cancel, RN Outcome: Not Progressing 01/31/2023 1630 by Paralee Cancel, RN Outcome: Progressing Goal: Will remain free from infection 01/31/2023 1630 by Paralee Cancel, RN Outcome: Progressing 01/31/2023 1630 by Paralee Cancel, RN Outcome: Progressing Goal: Diagnostic test results will improve 01/31/2023 1630 by Paralee Cancel, RN Outcome: Progressing 01/31/2023 1630 by Paralee Cancel, RN Outcome: Progressing Goal: Respiratory complications will improve 01/31/2023 1630 by Paralee Cancel, RN Outcome: Progressing 01/31/2023 1630 by Paralee Cancel, RN Outcome: Progressing Goal: Cardiovascular complication will be avoided 01/31/2023 1630 by Paralee Cancel, RN Outcome: Progressing 01/31/2023  1630 by Paralee Cancel, RN Outcome: Progressing   Problem: Activity: Goal: Risk for activity intolerance will decrease 01/31/2023 1630 by Paralee Cancel, RN Outcome: Progressing 01/31/2023 1630 by Paralee Cancel, RN Outcome: Progressing   Problem: Nutrition: Goal: Adequate nutrition will be maintained 01/31/2023 1630 by Paralee Cancel, RN Outcome: Progressing 01/31/2023 1630 by Paralee Cancel, RN Outcome: Progressing   Problem: Coping: Goal: Level of anxiety will decrease 01/31/2023 1630 by Paralee Cancel, RN Outcome: Progressing 01/31/2023 1630 by Paralee Cancel, RN Outcome: Progressing   Problem: Elimination: Goal: Will not experience complications related to bowel motility 01/31/2023 1630 by Paralee Cancel, RN Outcome: Progressing 01/31/2023 1630 by Paralee Cancel, RN Outcome: Progressing Goal: Will not experience complications related to urinary retention 01/31/2023 1630 by Paralee Cancel, RN Outcome: Progressing 01/31/2023 1630 by Paralee Cancel, RN Outcome: Progressing   Problem: Pain Managment: Goal: General experience of comfort will improve 01/31/2023 1630 by Paralee Cancel, RN Outcome: Progressing 01/31/2023 1630 by Paralee Cancel, RN Outcome: Progressing   Problem: Safety: Goal: Ability to remain free from injury will improve 01/31/2023 1630 by Paralee Cancel, RN Outcome: Progressing 01/31/2023 1630 by Paralee Cancel, RN Outcome: Progressing   Problem: Skin Integrity: Goal: Risk for impaired skin integrity will decrease 01/31/2023 1630 by Paralee Cancel, RN Outcome: Progressing 01/31/2023 1630 by Paralee Cancel, RN Outcome: Progressing

## 2023-01-31 NOTE — Progress Notes (Signed)
PROGRESS NOTE    Perry [name]  UYQ:[id number] DOB: April 01, 1956 DOA: 01/30/2023 PCP: Benjiman Core, MD    Chief Complaint  Patient presents with   Fever    Brief Narrative:  Perry [name] is a 67 y.o. 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  Fever at home 100.5 F,he complained of increased urinary frequency, but denies flank pain, chest pain, shortness of breath, abdominal pain, nausea or vomiting.   ED Course:  In emergency department, he was febrile with a temperature of 103.41F, pulse 101 bpm, BP 126/58, respiratory rate 20/min, O2 sat 94% on room air.  Shortly after arrival to the ED, Patient's O2 sats decreased to high 80s and was provided with supplemental oxygen at 3 LPM.  Patient does not use oxygen at baseline.  Workup in the ED showed WBC 19.0, hemoglobin 11.1, hematocrit 32.2, MCV 13.2, platelets 161, BMP showed sodium 135, potassium 3.4, chloride 106, bicarb 19, glucose 132, BUN 16, creatinine 1.82 (baseline creatinine is 0.9/1.0), albumin 3.3, lactic acid x 2 was normal, urinalysis was positive for UTI.  Blood culture and urine culture pending, SARS contrast was negative.  Chest x-ray showed no active disease. Chest x-ray showed no active disease Patient was treated with Tylenol, IV ceftriaxone was given and IV hydration was provided.  He was admitted for further evaluation and management.  Assessment & Plan:   Principal Problem:   Sepsis due to urinary tract infection (HCC) Active Problems:   Mixed hyperlipidemia   Hypokalemia   Acute respiratory failure with hypoxia (HCC)   Hypoalbuminemia   AKI (acute kidney injury) (HCC)   Essential hypertension   GERD (gastroesophageal reflux disease)   Chronic inflammatory demyelinating  polyneuropathy (HCC)   Urge incontinence   Sepsis secondary to UTI POA Gram-negative bacteremia Leukocytosis Patient met sepsis criteria due to being febrile, tachycardic (met SIRS criteria) and with source of infection being UTI (met sepsis criteria) Pulmonary blood cultures showing gram-negative rods. Patient was started on IV ceftriaxone, continue with this at this time Urine culture on 01/12/2016 was positive for E. coli and this was sensitive to ceftriaxone Continue on Tylenol as needed Continue IV hydration Follow-up on the final urine and blood cultures. Continue to monitor counts.   Acute respiratory failure with hypoxia It is currently unknown at this time why patient was hypoxic Chest x-ray showed no active disease Continue supplemental oxygen to maintain O2 sat of >92% with plan to wean patient off days as tolerated (patient does not use oxygen at baseline). However, he has inflammatory demyelinating polyneuropathy and in the setting of sepsis, bacteremia, UTI likely has worsening weakness leading to hypoxemia with O2 requirement. He denies having any cough at this time.  Continue to monitor and wean O2 as tolerated.   Acute kidney injury Creatinine 1.82 (baseline creatinine is 0.9/1.0) Continue IV hydration Renally adjust medications, avoid nephrotoxic agents/dehydration/hypotension   Hypokalemia Continue to monitor and replace as needed.   Hypoalbuminemia Albumin 3.3 Protein supplement will be provided   Microcytic anemia Hemoglobin 11.1, hematocrit 32.2, MCV 78.2 Iron studies ordered Continue to monitor hemoglobin.   Essential hypertension Continue amlodipine.  Continue to monitor blood pressure closely and adjust medications as needed.   GERD Continue Protonix   Mixed hyperlipidemia Continue pravastatin   Urge incontinence Continue oxybutynin   Chronic inflammatory demyelinating polyneuropathy Continue Neurontin,  baclofen   DVT prophylaxis:  Subcutaneous Lovenox Code Status: Full code   Antimicrobials:  IV ceftriaxone   Subjective: Patient seen and examined this morning.  He is complaining of generalized weakness.  He has chronic inflammatory demyelinating polyneuropathy.  Also having fevers.  Blood cultures preliminary results showing gram-negative rods.  Tolerating antibiotics.  Objective: Vitals:   01/31/23 0500 01/31/23 0600 01/31/23 0751 01/31/23 0811  BP: (!) 168/68 135/60  (!) 162/89  Pulse: 86 85    Resp: (!) 28 (!) 30    Temp:  99.5 F (37.5 C) 99.4 F (37.4 C)   TempSrc:  Oral Oral   SpO2: 93% 94%    Weight:      Height:        Intake/Output Summary (Last 24 hours) at 01/31/2023 0853 Last data filed at 01/31/2023 0820 Gross per 24 hour  Intake 2304.44 ml  Output 1500 ml  Net 804.44 ml   Filed Weights   01/30/23 2130 01/31/23 0430  Weight: 88.6 kg 89.5 kg    Examination: General: Chronically ill-appearing male, alert and oriented x3. HEENT: NCAT, EOMI Neck: Supple, trachea medial Cardiovascular: Regular rate and rhythm with no rubs or gallops. No lower extremity edema.  Respiratory: Decreased breath sounds lower lobes, no rhonchi or wheezing Abdomen: Soft, nontender nondistended with normal bowel sounds x4 quadrants. Muskuloskeletal: No edema noted bilaterally Neuro: Debility with generalized weakness, no focal deficits  Skin: No ulcerative lesions noted or rashes Psychiatry: Judgement and insight appear normal. Mood is appropriate for condition and setting    Data Reviewed: I have personally reviewed following labs and imaging studies  CBC: Recent Labs  Lab 01/30/23 1417 01/31/23 0457  WBC 19.0* 17.0*  NEUTROABS 16.0*  --   HGB 11.1* 11.2*  HCT 32.2* 32.9*  MCV 78.2* 78.7*  PLT 161 169    Basic Metabolic Panel: Recent Labs  Lab 01/30/23 1417 01/31/23 0457  NA 135 137  K 3.4* 3.9  CL 106 108  CO2 19* 19*  GLUCOSE 132* 98  BUN 16 18  CREATININE 1.82* 1.66*  CALCIUM 9.2  9.1  MG  --  1.9  PHOS  --  2.1*    GFR: Estimated Creatinine Clearance: 47.8 mL/min (A) (by C-G formula based on SCr of 1.66 mg/dL (H)).  Liver Function Tests: Recent Labs  Lab 01/30/23 1417 01/31/23 0457  AST 25 32  ALT 16 19  ALKPHOS 67 67  BILITOT 0.9 0.9  PROT 7.7 8.0  ALBUMIN 3.3* 3.2*    CBG: Recent Labs  Lab 01/31/23 0729  GLUCAP 102*     Recent Results (from the past 240 hour(s))  SARS Coronavirus 2 by RT PCR (hospital order, performed in [facility] hospital lab) *cepheid single result test* Anterior Nasal Swab     Status: None   Collection Time: 01/30/23 12:55 PM   Specimen: Anterior Nasal Swab  Result Value Ref Range Status   SARS Coronavirus 2 by RT PCR NEGATIVE NEGATIVE Final    Comment: (NOTE) SARS-CoV-2 target nucleic acids are NOT DETECTED.  The SARS-CoV-2 RNA is generally detectable in upper and lower respiratory specimens during the acute phase of infection. The lowest concentration of SARS-CoV-2 viral copies this assay can detect is 250 copies / mL. A negative result does not preclude SARS-CoV-2 infection and should not be used as the sole basis for treatment or other patient management decisions.  A negative result may occur with improper specimen collection / handling, submission of specimen other than nasopharyngeal  swab, presence of viral mutation(s) within the areas targeted by this assay, and inadequate number of viral copies (<250 copies / mL). A negative result must be combined with clinical observations, patient history, and epidemiological information.  Fact Sheet for Patients:   RoadLapTop.co.za  Fact Sheet for Healthcare Providers: http://kim-miller.com/  This test is not yet approved or  cleared by the [place] FDA and has been authorized for detection and/or diagnosis of SARS-CoV-2 by FDA under an Emergency Use Authorization (EUA).  This EUA will remain in effect (meaning this  test can be used) for the duration of the COVID-19 declaration under Section 564(b)(1) of the Act, 21 U.S.C. section 360bbb-3(b)(1), unless the authorization is terminated or revoked sooner.  Performed at [facility], [address] [place], [place] [address]   Blood Culture (routine x 2)     Status: None (Preliminary result)   Collection Time: 01/30/23  3:01 PM   Specimen: Left Antecubital; Blood  Result Value Ref Range Status   Specimen Description LEFT ANTECUBITAL  Final   Special Requests   Final    BOTTLES DRAWN AEROBIC AND ANAEROBIC Blood Culture results may not be optimal due to an excessive volume of blood received in culture bottles   Culture   Final    NO GROWTH < 24 HOURS Performed at [facility], [address] [place], [place] [address]    Report Status PENDING  Incomplete  Blood Culture (routine x 2)     Status: None (Preliminary result)   Collection Time: 01/30/23  3:05 PM   Specimen: Right Antecubital; Blood  Result Value Ref Range Status   Specimen Description   Final    RIGHT ANTECUBITAL Performed at [facility], [address], [place], [place] [address]    Special Requests   Final    BOTTLES DRAWN AEROBIC AND ANAEROBIC Blood Culture results may not be optimal due to an excessive volume of blood received in culture bottles Performed at [facility], [address] [place], [place] [address]    Culture  Setup Time   Final    GRAM NEGATIVE RODS ANAEROBIC BOTTLE Gram Stain Report Called to,Read Back By and Verified With: [email or web address]  BY MATTHEWS, B 8.22.2024 Organism ID to follow Performed at [facility], [address], [place], [place] [address]    Culture GRAM NEGATIVE RODS  Final   Report Status PENDING  Incomplete  MRSA Next Gen by PCR, Nasal     Status: None   Collection Time: 01/30/23  9:20 PM   Specimen: Nasal Mucosa; Nasal Swab  Result Value Ref Range Status   MRSA by PCR Next Gen NOT DETECTED NOT DETECTED Final    Comment: (NOTE) The  GeneXpert MRSA Assay (FDA approved for NASAL specimens only), is one component of a comprehensive MRSA colonization surveillance program. It is not intended to diagnose MRSA infection nor to guide or monitor treatment for MRSA infections. Test performance is not FDA approved in patients less than 42 years old. Performed at [facility], [address] [place], [place] [address]          Radiology Studies: DG Chest 1 View  Result Date: 01/30/2023 CLINICAL DATA:  Shortness of breath EXAM: CHEST  1 VIEW COMPARISON:  Radiograph 01/30/2023 FINDINGS: Stable cardiomediastinal silhouette. Aortic atherosclerotic calcification. No focal consolidation, pleural effusion, or pneumothorax. No displaced rib fractures. IMPRESSION: No acute cardiopulmonary disease. Electronically Signed   By: Minerva Fester M.D.   On: 01/30/2023 21:41   DG Chest  Portable 1 View  Result Date: 01/30/2023 CLINICAL DATA:  Fever. EXAM: PORTABLE CHEST 1 VIEW COMPARISON:  January 12, 2016. FINDINGS: The heart size and mediastinal contours are within normal limits. Both lungs are clear. The visualized skeletal structures are unremarkable. IMPRESSION: No active disease. Electronically Signed   By: Lupita Raider M.D.   On: 01/30/2023 15:35        Scheduled Meds:  amLODipine  10 mg Oral Daily   baclofen  20 mg Oral BID   Chlorhexidine Gluconate Cloth  6 each Topical Daily   enoxaparin (LOVENOX) injection  40 mg Subcutaneous Q24H   feeding supplement  237 mL Oral BID BM   gabapentin  600 mg Oral q morning   And   gabapentin  900 mg Oral Q1200   And   gabapentin  1,500 mg Oral QHS   oxybutynin  10 mg Oral Daily   pantoprazole  40 mg Oral Daily   pravastatin  10 mg Oral Daily   Continuous Infusions:  cefTRIAXone (ROCEPHIN)  IV Stopped (01/30/23 1641)   lactated ringers 150 mL/hr at 01/31/23 0432     LOS: 1 day    Time spent: 50 minutes    Vonzella Nipple, MD [facility]   To contact the attending  provider between 7A-7P or the covering provider during after hours 7P-7A, please log into the web site [email or web address] and access using universal [place] password for that web site. If you do not have the password, please call the hospital operator.  01/31/2023, 8:53 AM

## 2023-01-31 NOTE — Plan of Care (Signed)

## 2023-02-01 DIAGNOSIS — A419 Sepsis, unspecified organism: Secondary | ICD-10-CM | POA: Diagnosis not present

## 2023-02-01 DIAGNOSIS — N390 Urinary tract infection, site not specified: Secondary | ICD-10-CM | POA: Diagnosis not present

## 2023-02-01 LAB — BASIC METABOLIC PANEL
Anion gap: 14 (ref 5–15)
BUN: 16 mg/dL (ref 8–23)
CO2: 16 mmol/L — ABNORMAL LOW (ref 22–32)
Calcium: 9.2 mg/dL (ref 8.9–10.3)
Chloride: 108 mmol/L (ref 98–111)
Creatinine, Ser: 1.65 mg/dL — ABNORMAL HIGH (ref 0.61–1.24)
GFR, Estimated: 45 mL/min — ABNORMAL LOW (ref >60)
Glucose, Bld: 100 mg/dL — ABNORMAL HIGH (ref 70–99)
Potassium: 3.7 mmol/L (ref 3.5–5.1)
Sodium: 138 mmol/L (ref 135–145)

## 2023-02-01 LAB — CBC WITH DIFFERENTIAL/PLATELET
Abs Immature Granulocytes: 0.07 10*3/uL (ref 0.00–0.07)
Basophils Absolute: 0.1 10*3/uL (ref 0.0–0.1)
Basophils Relative: 0 %
Eosinophils Absolute: 0 10*3/uL (ref 0.0–0.5)
Eosinophils Relative: 0 %
HCT: 35.6 % — ABNORMAL LOW (ref 39.0–52.0)
Hemoglobin: 12.2 g/dL — ABNORMAL LOW (ref 13.0–17.0)
Immature Granulocytes: 0 %
Lymphocytes Relative: 11 %
Lymphs Abs: 1.9 10*3/uL (ref 0.7–4.0)
MCH: 26.7 pg (ref 26.0–34.0)
MCHC: 34.3 g/dL (ref 30.0–36.0)
MCV: 77.9 fL — ABNORMAL LOW (ref 80.0–100.0)
Monocytes Absolute: 1.5 10*3/uL — ABNORMAL HIGH (ref 0.1–1.0)
Monocytes Relative: 9 %
Neutro Abs: 14.1 10*3/uL — ABNORMAL HIGH (ref 1.7–7.7)
Neutrophils Relative %: 80 %
Platelets: 170 10*3/uL (ref 150–400)
RBC: 4.57 MIL/uL (ref 4.22–5.81)
RDW: 14.3 % (ref 11.5–15.5)
WBC: 17.7 10*3/uL — ABNORMAL HIGH (ref 4.0–10.5)
nRBC: 0 % (ref 0.0–0.2)

## 2023-02-01 LAB — URINE CULTURE: Culture: 60000 — AB

## 2023-02-01 MED ORDER — FERROUS SULFATE 325 (65 FE) MG PO TABS
325.0000 mg | ORAL_TABLET | Freq: Every day | ORAL | Status: DC
  Administered 2023-02-02 – 2023-02-04 (×3): 325 mg via ORAL
  Filled 2023-02-01 (×4): qty 1

## 2023-02-01 NOTE — Progress Notes (Signed)
PROGRESS NOTE    [name]  ZOX:[id number] DOB: 23-Mar-1956 DOA: 01/30/2023 PCP: Benjiman Core, MD    Chief Complaint  Patient presents with   Fever    Brief Narrative:  Perry [name] is a 67 y.o. 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  Fever at home 100.5 F,he complained of increased urinary frequency, but denies flank pain, chest pain, shortness of breath, abdominal pain, nausea or vomiting.   ED Course:  In emergency department, he was febrile with a temperature of 103.61F, pulse 101 bpm, BP 126/58, respiratory rate 20/min, O2 sat 94% on room air.  Shortly after arrival to the ED, Patient's O2 sats decreased to high 80s and was provided with supplemental oxygen at 3 LPM.  Patient does not use oxygen at baseline.  Workup in the ED showed WBC 19.0, hemoglobin 11.1, hematocrit 32.2, MCV 13.2, platelets 161, BMP showed sodium 135, potassium 3.4, chloride 106, bicarb 19, glucose 132, BUN 16, creatinine 1.82 (baseline creatinine is 0.9/1.0), albumin 3.3, lactic acid x 2 was normal, urinalysis was positive for UTI.  Blood culture and urine culture pending, SARS contrast was negative.  Chest x-ray showed no active disease. Chest x-ray showed no active disease Patient was treated with Tylenol, IV ceftriaxone was given and IV hydration was provided.  He was admitted for further evaluation and management.  Assessment & Plan:   Principal Problem:   Sepsis due to urinary tract infection (HCC) Active Problems:   Mixed hyperlipidemia   Hypokalemia   Acute respiratory failure with hypoxia (HCC)   Hypoalbuminemia   AKI (acute kidney injury) (HCC)   Essential hypertension   GERD (gastroesophageal reflux disease)   Chronic inflammatory demyelinating  polyneuropathy (HCC)   Urge incontinence   E. coli sepsis secondary to UTI --POA -Urine and blood cultures from 01/29/2019 2024 with E. coli -Continue IV Rocephin   Acute respiratory failure with hypoxia It is currently unknown at this time why patient was hypoxic Chest x-ray showed no active disease -Patient is a smoker suspect component of COPD -Currently requiring oxygen at 4 L via nasal cannula  -Continue to try to wean off O2 (patient does not use oxygen at baseline). -Repeat chest x-ray in a.m.   Acute kidney injury Creatinine 1.82 on admission  (baseline creatinine is 0.9/1.0) -Trended down with hydration Continue IV hydration Renally adjust medications, avoid nephrotoxic agents/dehydration/hypotension   Hypokalemia Continue to monitor and replace as needed.   Hypoalbuminemia Albumin 3.3 Protein supplement will be provided   Microcytic anemia -Workup consistent with iron deficiency with serum iron of 12 iron saturation of 6 -Hgb stable -Will need endoluminal evaluation as outpatient   Essential hypertension Continue amlodipine.    GERD Continue Protonix   Mixed hyperlipidemia Continue pravastatin   Urge incontinence Continue oxybutynin   Chronic inflammatory demyelinating polyneuropathy--at baseline ambulates with crutches and walker for short distance uses wheelchair for long distance for the last 2 years Continue Neurontin, baclofen   DVT prophylaxis: Subcutaneous Lovenox Code Status: Full code   Antimicrobials:  IV ceftriaxone   Subjective: -No fevers, generalized weakness persist, -mild Cough and hypoxia persist  Objective: Vitals:   02/01/23 1400 02/01/23 1500 02/01/23 1600 02/01/23 1645  BP: (!) 141/76 (!) 146/85 (!) 157/73   Pulse: 71 77 74   Resp: 19 20 (!) 22   Temp:  98.2 F (36.8 C)  TempSrc:    Oral  SpO2: 96% 93% (!) 87%   Weight:      Height:       No intake or output data in the 24 hours ending 02/01/23 1727  Filed  Weights   01/30/23 2130 01/31/23 0430 02/01/23 0451  Weight: 88.6 kg 89.5 kg 90.2 kg     Physical Exam  Gen:- Awake Alert, in no acute distress  HEENT:- [place].AT, No sclera icterus Nose- [place] 4L/min Neck-Supple Neck,No JVD,.  Lungs-improving air movement, few scattered rhonchi especially on the right  CV- S1, S2 normal, RRR Abd-  +ve B.Sounds, Abd Soft, No tenderness,    Extremity/Skin:- No  edema,   good pedal pulses  Psych-affect is appropriate, oriented x3 Neuro-generalized weakness, unsteady gait with balance issues, due to  chronic inflammatory demyelinating polyneuropathynot new, no new focal deficits, no tremors    Data Reviewed: I have personally reviewed following labs and imaging studies  CBC: Recent Labs  Lab 01/30/23 1417 01/31/23 0457 02/01/23 0509  WBC 19.0* 17.0* 17.7*  NEUTROABS 16.0*  --  14.1*  HGB 11.1* 11.2* 12.2*  HCT 32.2* 32.9* 35.6*  MCV 78.2* 78.7* 77.9*  PLT 161 169 170   Basic Metabolic Panel: Recent Labs  Lab 01/30/23 1417 01/31/23 0457 02/01/23 0509  NA 135 137 138  K 3.4* 3.9 3.7  CL 106 108 108  CO2 19* 19* 16*  GLUCOSE 132* 98 100*  BUN 16 18 16   CREATININE 1.82* 1.66* 1.65*  CALCIUM 9.2 9.1 9.2  MG  --  1.9  --   PHOS  --  2.1*  --    GFR: Estimated Creatinine Clearance: 48.2 mL/min (A) (by C-G formula based on SCr of 1.65 mg/dL (H)).  Liver Function Tests: Recent Labs  Lab 01/30/23 1417 01/31/23 0457  AST 25 32  ALT 16 19  ALKPHOS 67 67  BILITOT 0.9 0.9  PROT 7.7 8.0  ALBUMIN 3.3* 3.2*   CBG: Recent Labs  Lab 01/31/23 0729  GLUCAP 102*    Recent Results (from the past 240 hour(s))  SARS Coronavirus 2 by RT PCR (hospital order, performed in [facility] hospital lab) *cepheid single result test* Anterior Nasal Swab     Status: None   Collection Time: 01/30/23 12:55 PM   Specimen: Anterior Nasal Swab  Result Value Ref Range Status   SARS Coronavirus 2 by RT PCR NEGATIVE NEGATIVE Final    Comment:  (NOTE) SARS-CoV-2 target nucleic acids are NOT DETECTED.  The SARS-CoV-2 RNA is generally detectable in upper and lower respiratory specimens during the acute phase of infection. The lowest concentration of SARS-CoV-2 viral copies this assay can detect is 250 copies / mL. A negative result does not preclude SARS-CoV-2 infection and should not be used as the sole basis for treatment or other patient management decisions.  A negative result may occur with improper specimen collection / handling, submission of specimen other than nasopharyngeal swab, presence of viral mutation(s) within the areas targeted by this assay, and inadequate number of viral copies (<250 copies / mL). A negative result must be combined with clinical observations, patient history, and epidemiological information.  Fact Sheet for Patients:   RoadLapTop.co.za  Fact Sheet for Healthcare Providers: http://kim-miller.com/  This test is not yet approved or  cleared by the [place] FDA and has been authorized for detection and/or diagnosis of SARS-CoV-2 by FDA under an Emergency Use Authorization (EUA).  This EUA will remain in effect (  meaning this test can be used) for the duration of the COVID-19 declaration under Section 564(b)(1) of the Act, 21 U.S.C. section 360bbb-3(b)(1), unless the authorization is terminated or revoked sooner.  Performed at [facility], [address] [place], [place] [address]   Blood Culture (routine x 2)     Status: None (Preliminary result)   Collection Time: 01/30/23  3:01 PM   Specimen: Left Antecubital; Blood  Result Value Ref Range Status   Specimen Description   Final    LEFT ANTECUBITAL Performed at [facility], [address] [place], [place] [address]    Special Requests   Final    BOTTLES DRAWN AEROBIC AND ANAEROBIC Blood Culture results may not be optimal due to an excessive volume of blood received in culture  bottles Performed at [facility], [address], [place], [place] [address]    Culture  Setup Time   Final    AEROBIC BOTTLE ONLY GRAM NEGATIVE RODS CRITICAL VALUE NOTED.  VALUE IS CONSISTENT WITH PREVIOUSLY REPORTED AND CALLED VALUE. ANAEROBIC BOTTLE ALSO Performed at [facility], [address], [place], [place] [address]    Culture GRAM NEGATIVE RODS  Final   Report Status PENDING  Incomplete  Blood Culture (routine x 2)     Status: Abnormal (Preliminary result)   Collection Time: 01/30/23  3:05 PM   Specimen: Right Antecubital; Blood  Result Value Ref Range Status   Specimen Description   Final    RIGHT ANTECUBITAL Performed at [facility], [address], [place], [place] [address]    Special Requests   Final    BOTTLES DRAWN AEROBIC AND ANAEROBIC Blood Culture results may not be optimal due to an excessive volume of blood received in culture bottles Performed at [facility], [address], [place], [place] [address]    Culture  Setup Time   Final    GRAM NEGATIVE RODS ANAEROBIC BOTTLE Gram Stain Report Called to,Read Back By and Verified With: [email or web address]  BY MATTHEWS, B 8.22.2024 CRITICAL RESULT CALLED TO, READ BACK BY AND VERIFIED WITH: PHARMD LORIE P. [id number] FCP  AEROBIC BOTTLE ONLY CRITICAL VALUE NOTED.  VALUE IS CONSISTENT WITH PREVIOUSLY REPORTED AND CALLED VALUE. GRAM NEGATIVE RODS Performed at [facility], [address] [place], [place] [address]    Culture (A)  Final    ESCHERICHIA COLI SUSCEPTIBILITIES TO FOLLOW Performed at [facility], [address], [place], [place] [address]    Report Status PENDING  Incomplete  Blood Culture ID Panel (Reflexed)     Status: Abnormal   Collection Time: 01/30/23  3:05 PM  Result Value Ref Range Status   Enterococcus faecalis NOT DETECTED NOT DETECTED Final   Enterococcus Faecium NOT DETECTED NOT DETECTED Final   Listeria monocytogenes NOT DETECTED NOT DETECTED Final   Staphylococcus species NOT  DETECTED NOT DETECTED Final   Staphylococcus aureus (BCID) NOT DETECTED NOT DETECTED Final   Staphylococcus epidermidis NOT DETECTED NOT DETECTED Final   Staphylococcus lugdunensis NOT DETECTED NOT DETECTED Final   Streptococcus species NOT DETECTED NOT DETECTED Final   Streptococcus agalactiae NOT DETECTED NOT DETECTED Final   Streptococcus pneumoniae NOT DETECTED NOT DETECTED Final   Streptococcus pyogenes NOT DETECTED NOT DETECTED Final   A.calcoaceticus-baumannii NOT DETECTED NOT DETECTED Final   Bacteroides fragilis NOT DETECTED NOT DETECTED Final   Enterobacterales DETECTED (A) NOT DETECTED Final    Comment: Enterobacterales represent a large order of gram negative bacteria, not a single organism. CRITICAL RESULT CALLED TO,  READ BACK BY AND VERIFIED WITH: PHARMD LORIE P. [id number] 1012 FCP     Enterobacter cloacae complex NOT DETECTED NOT DETECTED Final   Escherichia coli DETECTED (A) NOT DETECTED Final    Comment: CRITICAL RESULT CALLED TO, READ BACK BY AND VERIFIED WITH: PHARMD LORIE P. [id number] [id number] FCP     Klebsiella aerogenes NOT DETECTED NOT DETECTED Final   Klebsiella oxytoca NOT DETECTED NOT DETECTED Final   Klebsiella pneumoniae NOT DETECTED NOT DETECTED Final   Proteus species NOT DETECTED NOT DETECTED Final   Salmonella species NOT DETECTED NOT DETECTED Final   Serratia marcescens NOT DETECTED NOT DETECTED Final   Haemophilus influenzae NOT DETECTED NOT DETECTED Final   Neisseria meningitidis NOT DETECTED NOT DETECTED Final   Pseudomonas aeruginosa NOT DETECTED NOT DETECTED Final   Stenotrophomonas maltophilia NOT DETECTED NOT DETECTED Final   Candida albicans NOT DETECTED NOT DETECTED Final   Candida auris NOT DETECTED NOT DETECTED Final   Candida glabrata NOT DETECTED NOT DETECTED Final   Candida krusei NOT DETECTED NOT DETECTED Final   Candida parapsilosis NOT DETECTED NOT DETECTED Final   Candida tropicalis NOT DETECTED NOT DETECTED Final   Cryptococcus  neoformans/gattii NOT DETECTED NOT DETECTED Final   CTX-M ESBL NOT DETECTED NOT DETECTED Final   Carbapenem resistance IMP NOT DETECTED NOT DETECTED Final   Carbapenem resistance KPC NOT DETECTED NOT DETECTED Final   Carbapenem resistance NDM NOT DETECTED NOT DETECTED Final   Carbapenem resist OXA 48 LIKE NOT DETECTED NOT DETECTED Final   Carbapenem resistance VIM NOT DETECTED NOT DETECTED Final    Comment: Performed at [facility], [address] [place], [place] [address]  Urine Culture     Status: Abnormal   Collection Time: 01/30/23  4:15 PM   Specimen: Urine, Clean Catch  Result Value Ref Range Status   Specimen Description   Final    URINE, CLEAN CATCH Performed at [facility], [address], [place], [place] [address]    Special Requests   Final    NONE Performed at [facility], [address] [place], [place] [address]    Culture 60,000 COLONIES/mL ESCHERICHIA COLI (A)  Final   Report Status 02/01/2023 FINAL  Final   Organism ID, Bacteria ESCHERICHIA COLI (A)  Final      Susceptibility   Escherichia coli - MIC*    AMPICILLIN >=32 RESISTANT Resistant     CEFAZOLIN <=4 SENSITIVE Sensitive     CEFEPIME <=0.12 SENSITIVE Sensitive     CEFTRIAXONE <=0.25 SENSITIVE Sensitive     CIPROFLOXACIN <=0.25 SENSITIVE Sensitive     GENTAMICIN <=1 SENSITIVE Sensitive     IMIPENEM <=0.25 SENSITIVE Sensitive     NITROFURANTOIN <=16 SENSITIVE Sensitive     TRIMETH/SULFA <=20 SENSITIVE Sensitive     AMPICILLIN/SULBACTAM >=32 RESISTANT Resistant     PIP/TAZO <=4 SENSITIVE Sensitive     * 60,000 COLONIES/mL ESCHERICHIA COLI  MRSA Next Gen by PCR, Nasal     Status: None   Collection Time: 01/30/23  9:20 PM   Specimen: Nasal Mucosa; Nasal Swab  Result Value Ref Range Status   MRSA by PCR Next Gen NOT DETECTED NOT DETECTED Final    Comment: (NOTE) The GeneXpert MRSA Assay (FDA approved for NASAL specimens only), is one component of a comprehensive MRSA colonization  surveillance program. It is not intended to diagnose MRSA infection nor to guide or monitor treatment for MRSA infections. Test performance is not FDA approved in patients less than 2  years old. Performed at [facility], [address], [place], [place] [address]     Radiology Studies: DG Chest 1 View  Result Date: 01/30/2023 CLINICAL DATA:  Shortness of breath EXAM: CHEST  1 VIEW COMPARISON:  Radiograph 01/30/2023 FINDINGS: Stable cardiomediastinal silhouette. Aortic atherosclerotic calcification. No focal consolidation, pleural effusion, or pneumothorax. No displaced rib fractures. IMPRESSION: No acute cardiopulmonary disease. Electronically Signed   By: Minerva Fester M.D.   On: 01/30/2023 21:41    Scheduled Meds:  amLODipine  10 mg Oral Daily   baclofen  20 mg Oral BID   Chlorhexidine Gluconate Cloth  6 each Topical Daily   enoxaparin (LOVENOX) injection  40 mg Subcutaneous Q24H   feeding supplement  237 mL Oral BID BM   gabapentin  600 mg Oral q morning   And   gabapentin  900 mg Oral Q1200   And   gabapentin  1,500 mg Oral QHS   oxybutynin  10 mg Oral Daily   pantoprazole  40 mg Oral Daily   pravastatin  10 mg Oral Daily   Continuous Infusions:  cefTRIAXone (ROCEPHIN)  IV 2 g (02/01/23 1624)    LOS: 2 days   Shon Hale, MD [facility]   To contact the attending provider between 7A-7P or the covering provider during after hours 7P-7A, please log into the web site [email or web address] and access using universal [place] password for that web site. If you do not have the password, please call the hospital operator.  02/01/2023, 5:27 PM

## 2023-02-01 NOTE — Care Management Important Message (Signed)
Important Message  Patient Details  Name: Perry [name] MRN: [id number] Date of Birth: [birth year]   Medicare Important Message Given:  N/A - LOS <3 / Initial given by admissions     Johnell Comings 02/01/2023, 9:26 AM

## 2023-02-01 NOTE — Plan of Care (Signed)
  Problem: Clinical Measurements: Goal: Ability to maintain clinical measurements within normal limits will improve Outcome: Progressing Goal: Respiratory complications will improve Outcome: Progressing Goal: Cardiovascular complication will be avoided Outcome: Progressing   Problem: Coping: Goal: Level of anxiety will decrease Outcome: Progressing   Problem: Pain Managment: Goal: General experience of comfort will improve Outcome: Progressing   Problem: Safety: Goal: Ability to remain free from injury will improve Outcome: Progressing

## 2023-02-02 ENCOUNTER — Inpatient Hospital Stay (HOSPITAL_COMMUNITY): Payer: Medicare PPO

## 2023-02-02 DIAGNOSIS — A419 Sepsis, unspecified organism: Secondary | ICD-10-CM | POA: Diagnosis not present

## 2023-02-02 DIAGNOSIS — N390 Urinary tract infection, site not specified: Secondary | ICD-10-CM | POA: Diagnosis not present

## 2023-02-02 LAB — CBC
HCT: 32.4 % — ABNORMAL LOW (ref 39.0–52.0)
Hemoglobin: 11.1 g/dL — ABNORMAL LOW (ref 13.0–17.0)
MCH: 26.4 pg (ref 26.0–34.0)
MCHC: 34.3 g/dL (ref 30.0–36.0)
MCV: 77 fL — ABNORMAL LOW (ref 80.0–100.0)
Platelets: 164 10*3/uL (ref 150–400)
RBC: 4.21 MIL/uL — ABNORMAL LOW (ref 4.22–5.81)
RDW: 14.4 % (ref 11.5–15.5)
WBC: 11.7 10*3/uL — ABNORMAL HIGH (ref 4.0–10.5)
nRBC: 0 % (ref 0.0–0.2)

## 2023-02-02 LAB — RENAL FUNCTION PANEL
Albumin: 2.6 g/dL — ABNORMAL LOW (ref 3.5–5.0)
Anion gap: 8 (ref 5–15)
BUN: 20 mg/dL (ref 8–23)
CO2: 21 mmol/L — ABNORMAL LOW (ref 22–32)
Calcium: 9 mg/dL (ref 8.9–10.3)
Chloride: 107 mmol/L (ref 98–111)
Creatinine, Ser: 1.42 mg/dL — ABNORMAL HIGH (ref 0.61–1.24)
GFR, Estimated: 54 mL/min — ABNORMAL LOW (ref >60)
Glucose, Bld: 113 mg/dL — ABNORMAL HIGH (ref 70–99)
Phosphorus: 2.9 mg/dL (ref 2.5–4.6)
Potassium: 3.5 mmol/L (ref 3.5–5.1)
Sodium: 136 mmol/L (ref 135–145)

## 2023-02-02 LAB — CULTURE, BLOOD (ROUTINE X 2)

## 2023-02-02 MED ORDER — FUROSEMIDE 20 MG PO TABS
20.0000 mg | ORAL_TABLET | Freq: Once | ORAL | Status: AC
  Administered 2023-02-02: 20 mg via ORAL
  Filled 2023-02-02: qty 1

## 2023-02-02 MED ORDER — FUROSEMIDE 10 MG/ML IJ SOLN
20.0000 mg | Freq: Every day | INTRAMUSCULAR | Status: DC
  Administered 2023-02-03 – 2023-02-04 (×2): 20 mg via INTRAVENOUS
  Filled 2023-02-02 (×2): qty 2

## 2023-02-02 MED ORDER — POTASSIUM CHLORIDE CRYS ER 20 MEQ PO TBCR
40.0000 meq | EXTENDED_RELEASE_TABLET | Freq: Once | ORAL | Status: AC
  Administered 2023-02-02: 40 meq via ORAL
  Filled 2023-02-02: qty 2

## 2023-02-02 NOTE — Progress Notes (Signed)
Patient's oxygen removed this morning. Patient maintaining sats around 90s on room air. Patient has tolerated sitting up in the chair well.

## 2023-02-02 NOTE — Progress Notes (Signed)
PROGRESS NOTE    Perry [name]  HYW:[id number] DOB: [birth year] DOA: 01/30/2023 PCP: Benjiman Core, MD    Chief Complaint  Patient presents with   Fever    Brief Narrative:  Perry [name] is a 67 y.o. 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  Fever at home 100.5 F,he complained of increased urinary frequency, but denies flank pain, chest pain, shortness of breath, abdominal pain, nausea or vomiting.   ED Course:  In emergency department, he was febrile with a temperature of 103.62F, pulse 101 bpm, BP 126/58, respiratory rate 20/min, O2 sat 94% on room air.  Shortly after arrival to the ED, Patient's O2 sats decreased to high 80s and was provided with supplemental oxygen at 3 LPM.  Patient does not use oxygen at baseline.  Workup in the ED showed WBC 19.0, hemoglobin 11.1, hematocrit 32.2, MCV 13.2, platelets 161, BMP showed sodium 135, potassium 3.4, chloride 106, bicarb 19, glucose 132, BUN 16, creatinine 1.82 (baseline creatinine is 0.9/1.0), albumin 3.3, lactic acid x 2 was normal, urinalysis was positive for UTI.  Blood culture and urine culture pending, SARS contrast was negative.  Chest x-ray showed no active disease. Chest x-ray showed no active disease Patient was treated with Tylenol, IV ceftriaxone was given and IV hydration was provided.  He was admitted for further evaluation and management.  Assessment & Plan:   Principal Problem:   Sepsis due to urinary tract infection (HCC) Active Problems:   Mixed hyperlipidemia   Hypokalemia   Acute respiratory failure with hypoxia (HCC)   Hypoalbuminemia   AKI (acute kidney injury) (HCC)   Essential hypertension   GERD (gastroesophageal reflux disease)   Chronic inflammatory demyelinating  polyneuropathy (HCC)   Urge incontinence   E. coli sepsis secondary to UTI --POA -Urine and blood cultures from 01/29/2023 with E. coli -Continue IV Rocephin   Acute respiratory failure with hypoxia It is currently unknown at this time why patient was hypoxic Chest x-ray showed no active disease -Patient is a smoker suspect component of COPD -Currently weaned off oxygen -Continue to try to wean off O2 (patient does not use oxygen at baseline). -Repeat chest x-ray with pulmonary vascular congestion without frank pulmonary edema -Start Lasix as ordered   Acute kidney injury Creatinine 1.82 on admission  (baseline creatinine is 0.9/1.0) -Trended down with hydration Continue IV hydration Renally adjust medications, avoid nephrotoxic agents/dehydration/hypotension   Hypokalemia Continue to monitor and replace as needed.   Hypoalbuminemia Albumin 3.3 Protein supplement will be provided   Microcytic anemia -Workup consistent with iron deficiency with serum iron of 12 iron saturation of 6 -Hgb stable -Will need endoluminal evaluation as outpatient   Essential hypertension Continue amlodipine.    GERD Continue Protonix   Mixed hyperlipidemia Continue pravastatin   Urge incontinence Continue oxybutynin   Chronic inflammatory demyelinating polyneuropathy--at baseline ambulates with crutches and walker for short distance uses wheelchair for long distance for the last 2 years Continue Neurontin, baclofen   DVT prophylaxis: Subcutaneous Lovenox Code Status: Full code   Antimicrobials:  IV ceftriaxone   Subjective: -No fevers, generalized weakness persist, -Hypoxia resolved and weaning off oxygen  Objective: Vitals:   02/02/23 1417 02/02/23 1500 02/02/23 1600 02/02/23 1744  BP: (!) 144/54 133/73 (!) 155/75 (!) 109/96  Pulse: 69 65 71 80  Resp: 19 19 (!) 21  16  Temp:    99.6 F (37.6 C)  TempSrc:    Oral  SpO2: 90% (!) 88% (!) 89% 93%  Weight:      Height:         Intake/Output Summary (Last 24 hours) at 02/02/2023 2000 Last data filed at 02/02/2023 1734 Gross per 24 hour  Intake 731.02 ml  Output 2050 ml  Net -1318.98 ml    Filed Weights   01/31/23 0430 02/01/23 0451 02/02/23 0513  Weight: 89.5 kg 90.2 kg 87.2 kg     Physical Exam  Gen:- Awake Alert, in no acute distress  HEENT:- [place].AT, No sclera icterus Neck-Supple Neck,No JVD,.  Lungs-improving air movement, faint bibasilar rales CV- S1, S2 normal, RRR Abd-  +ve B.Sounds, Abd Soft, No tenderness,    Extremity/Skin:- No  edema,   good pedal pulses  Psych-affect is appropriate, oriented x3 Neuro-generalized weakness, unsteady gait with balance issues, due to  chronic inflammatory demyelinating polyneuropathynot new, no new focal deficits, no tremors    Data Reviewed: I have personally reviewed following labs and imaging studies  CBC: Recent Labs  Lab 01/30/23 1417 01/31/23 0457 02/01/23 0509 02/02/23 0452  WBC 19.0* 17.0* 17.7* 11.7*  NEUTROABS 16.0*  --  14.1*  --   HGB 11.1* 11.2* 12.2* 11.1*  HCT 32.2* 32.9* 35.6* 32.4*  MCV 78.2* 78.7* 77.9* 77.0*  PLT 161 169 170 164   Basic Metabolic Panel: Recent Labs  Lab 01/30/23 1417 01/31/23 0457 02/01/23 0509 02/02/23 0452  NA 135 137 138 136  K 3.4* 3.9 3.7 3.5  CL 106 108 108 107  CO2 19* 19* 16* 21*  GLUCOSE 132* 98 100* 113*  BUN 16 18 16 20   CREATININE 1.82* 1.66* 1.65* 1.42*  CALCIUM 9.2 9.1 9.2 9.0  MG  --  1.9  --   --   PHOS  --  2.1*  --  2.9   GFR: Estimated Creatinine Clearance: 55.2 mL/min (A) (by C-G formula based on SCr of 1.42 mg/dL (H)).  Liver Function Tests: Recent Labs  Lab 01/30/23 1417 01/31/23 0457 02/02/23 0452  AST 25 32  --   ALT 16 19  --   ALKPHOS 67 67  --   BILITOT 0.9 0.9  --   PROT 7.7 8.0  --   ALBUMIN 3.3* 3.2* 2.6*   CBG: Recent Labs  Lab 01/31/23 0729  GLUCAP 102*    Recent Results (from the past 240 hour(s))  SARS Coronavirus 2 by RT PCR (hospital order,  performed in [facility] hospital lab) *cepheid single result test* Anterior Nasal Swab     Status: None   Collection Time: 01/30/23 12:55 PM   Specimen: Anterior Nasal Swab  Result Value Ref Range Status   SARS Coronavirus 2 by RT PCR NEGATIVE NEGATIVE Final    Comment: (NOTE) SARS-CoV-2 target nucleic acids are NOT DETECTED.  The SARS-CoV-2 RNA is generally detectable in upper and lower respiratory specimens during the acute phase of infection. The lowest concentration of SARS-CoV-2 viral copies this assay can detect is 250 copies / mL. A negative result does not preclude SARS-CoV-2 infection and should not be used as the sole basis for treatment or other patient management decisions.  A negative result may occur with improper specimen collection / handling, submission of specimen other than nasopharyngeal swab, presence of viral mutation(s) within the areas targeted by this assay, and inadequate number of viral copies (<250 copies / mL). A negative result must be combined with  clinical observations, patient history, and epidemiological information.  Fact Sheet for Patients:   RoadLapTop.co.za  Fact Sheet for Healthcare Providers: http://kim-miller.com/  This test is not yet approved or  cleared by the [place] FDA and has been authorized for detection and/or diagnosis of SARS-CoV-2 by FDA under an Emergency Use Authorization (EUA).  This EUA will remain in effect (meaning this test can be used) for the duration of the COVID-19 declaration under Section 564(b)(1) of the Act, 21 U.S.C. section 360bbb-3(b)(1), unless the authorization is terminated or revoked sooner.  Performed at [facility], [address], [place], [place] [address]   Blood Culture (routine x 2)     Status: Abnormal   Collection Time: 01/30/23  3:01 PM   Specimen: Left Antecubital; Blood  Result Value Ref Range Status   Specimen Description   Final    LEFT  ANTECUBITAL Performed at [facility], [address], [place], [place] [address]    Special Requests   Final    BOTTLES DRAWN AEROBIC AND ANAEROBIC Blood Culture results may not be optimal due to an excessive volume of blood received in culture bottles Performed at [facility], [address] [place], [place] [address]    Culture  Setup Time   Final    AEROBIC BOTTLE ONLY GRAM NEGATIVE RODS CRITICAL VALUE NOTED.  VALUE IS CONSISTENT WITH PREVIOUSLY REPORTED AND CALLED VALUE. ANAEROBIC BOTTLE ALSO Performed at [facility], [address] [place], [place] [address]    Culture (A)  Final    ESCHERICHIA COLI SUSCEPTIBILITIES PERFORMED ON PREVIOUS CULTURE WITHIN THE LAST 5 DAYS. Performed at [facility], [address], [place], [place] [address]    Report Status 02/02/2023 FINAL  Final  Blood Culture (routine x 2)     Status: Abnormal   Collection Time: 01/30/23  3:05 PM   Specimen: Right Antecubital; Blood  Result Value Ref Range Status   Specimen Description   Final    RIGHT ANTECUBITAL Performed at [facility], [address], [place], [place] [address]    Special Requests   Final    BOTTLES DRAWN AEROBIC AND ANAEROBIC Blood Culture results may not be optimal due to an excessive volume of blood received in culture bottles Performed at [facility], [address], [place], [place] [address]    Culture  Setup Time   Final    GRAM NEGATIVE RODS ANAEROBIC BOTTLE Gram Stain Report Called to,Read Back By and Verified With: KINDLEY,[email or web address]  BY MATTHEWS, B 8.22.2024 CRITICAL RESULT CALLED TO, READ BACK BY AND VERIFIED WITH: PHARMD LORIE P. [id number] FCP  AEROBIC BOTTLE ONLY CRITICAL VALUE NOTED.  VALUE IS CONSISTENT WITH PREVIOUSLY REPORTED AND CALLED VALUE. GRAM NEGATIVE RODS Performed at [facility], [address], [place], [place] [address]    Culture ESCHERICHIA COLI (A)  Final   Report Status 02/02/2023 FINAL  Final   Organism ID, Bacteria ESCHERICHIA COLI  Final       Susceptibility   Escherichia coli - MIC*    AMPICILLIN >=32 RESISTANT Resistant     CEFEPIME <=0.12 SENSITIVE Sensitive     CEFTAZIDIME <=1 SENSITIVE Sensitive     CEFTRIAXONE <=0.25 SENSITIVE Sensitive     CIPROFLOXACIN <=0.25 SENSITIVE Sensitive     GENTAMICIN <=1 SENSITIVE Sensitive     IMIPENEM <=0.25 SENSITIVE Sensitive     TRIMETH/SULFA <=20 SENSITIVE Sensitive     AMPICILLIN/SULBACTAM 16 INTERMEDIATE Intermediate     PIP/TAZO <=4 SENSITIVE Sensitive     *  ESCHERICHIA COLI  Blood Culture ID Panel (Reflexed)     Status: Abnormal   Collection Time: 01/30/23  3:05 PM  Result Value Ref Range Status   Enterococcus faecalis NOT DETECTED NOT DETECTED Final   Enterococcus Faecium NOT DETECTED NOT DETECTED Final   Listeria monocytogenes NOT DETECTED NOT DETECTED Final   Staphylococcus species NOT DETECTED NOT DETECTED Final   Staphylococcus aureus (BCID) NOT DETECTED NOT DETECTED Final   Staphylococcus epidermidis NOT DETECTED NOT DETECTED Final   Staphylococcus lugdunensis NOT DETECTED NOT DETECTED Final   Streptococcus species NOT DETECTED NOT DETECTED Final   Streptococcus agalactiae NOT DETECTED NOT DETECTED Final   Streptococcus pneumoniae NOT DETECTED NOT DETECTED Final   Streptococcus pyogenes NOT DETECTED NOT DETECTED Final   A.calcoaceticus-baumannii NOT DETECTED NOT DETECTED Final   Bacteroides fragilis NOT DETECTED NOT DETECTED Final   Enterobacterales DETECTED (A) NOT DETECTED Final    Comment: Enterobacterales represent a large order of gram negative bacteria, not a single organism. CRITICAL RESULT CALLED TO, READ BACK BY AND VERIFIED WITH: PHARMD LORIE P. [id number] 1012 FCP     Enterobacter cloacae complex NOT DETECTED NOT DETECTED Final   Escherichia coli DETECTED (A) NOT DETECTED Final    Comment: CRITICAL RESULT CALLED TO, READ BACK BY AND VERIFIED WITH: PHARMD LORIE P. [id number] 1012 FCP     Klebsiella aerogenes NOT DETECTED NOT DETECTED Final   Klebsiella oxytoca  NOT DETECTED NOT DETECTED Final   Klebsiella pneumoniae NOT DETECTED NOT DETECTED Final   Proteus species NOT DETECTED NOT DETECTED Final   Salmonella species NOT DETECTED NOT DETECTED Final   Serratia marcescens NOT DETECTED NOT DETECTED Final   Haemophilus influenzae NOT DETECTED NOT DETECTED Final   Neisseria meningitidis NOT DETECTED NOT DETECTED Final   Pseudomonas aeruginosa NOT DETECTED NOT DETECTED Final   Stenotrophomonas maltophilia NOT DETECTED NOT DETECTED Final   Candida albicans NOT DETECTED NOT DETECTED Final   Candida auris NOT DETECTED NOT DETECTED Final   Candida glabrata NOT DETECTED NOT DETECTED Final   Candida krusei NOT DETECTED NOT DETECTED Final   Candida parapsilosis NOT DETECTED NOT DETECTED Final   Candida tropicalis NOT DETECTED NOT DETECTED Final   Cryptococcus neoformans/gattii NOT DETECTED NOT DETECTED Final   CTX-M ESBL NOT DETECTED NOT DETECTED Final   Carbapenem resistance IMP NOT DETECTED NOT DETECTED Final   Carbapenem resistance KPC NOT DETECTED NOT DETECTED Final   Carbapenem resistance NDM NOT DETECTED NOT DETECTED Final   Carbapenem resist OXA 48 LIKE NOT DETECTED NOT DETECTED Final   Carbapenem resistance VIM NOT DETECTED NOT DETECTED Final    Comment: Performed at [facility], [address], [place], [place] [address]  Urine Culture     Status: Abnormal   Collection Time: 01/30/23  4:15 PM   Specimen: Urine, Clean Catch  Result Value Ref Range Status   Specimen Description   Final    URINE, CLEAN CATCH Performed at [facility], [address], [place], [place] [address]    Special Requests   Final    NONE Performed at [facility], [address], [place], [place] [address]    Culture 60,000 COLONIES/mL ESCHERICHIA COLI (A)  Final   Report Status 02/01/2023 FINAL  Final   Organism ID, Bacteria ESCHERICHIA COLI (A)  Final      Susceptibility   Escherichia coli - MIC*    AMPICILLIN >=32 RESISTANT Resistant     CEFAZOLIN <=4  SENSITIVE Sensitive     CEFEPIME <=0.12  SENSITIVE Sensitive     CEFTRIAXONE <=0.25 SENSITIVE Sensitive     CIPROFLOXACIN <=0.25 SENSITIVE Sensitive     GENTAMICIN <=1 SENSITIVE Sensitive     IMIPENEM <=0.25 SENSITIVE Sensitive     NITROFURANTOIN <=16 SENSITIVE Sensitive     TRIMETH/SULFA <=20 SENSITIVE Sensitive     AMPICILLIN/SULBACTAM >=32 RESISTANT Resistant     PIP/TAZO <=4 SENSITIVE Sensitive     * 60,000 COLONIES/mL ESCHERICHIA COLI  MRSA Next Gen by PCR, Nasal     Status: None   Collection Time: 01/30/23  9:20 PM   Specimen: Nasal Mucosa; Nasal Swab  Result Value Ref Range Status   MRSA by PCR Next Gen NOT DETECTED NOT DETECTED Final    Comment: (NOTE) The GeneXpert MRSA Assay (FDA approved for NASAL specimens only), is one component of a comprehensive MRSA colonization surveillance program. It is not intended to diagnose MRSA infection nor to guide or monitor treatment for MRSA infections. Test performance is not FDA approved in patients less than 51 years old. Performed at [facility], [address], [place], [place] [address]     Radiology Studies: [facility] Chest [facility] 1 View  Result Date: 02/02/2023 CLINICAL DATA:  Dyspnea and respiratory abnormalities. EXAM: PORTABLE CHEST 1 VIEW COMPARISON:  One-view chest x-ray 01/30/2023 FINDINGS: The heart size is normal. Lung volumes are low. Mild pulmonary vascular congestion is present. No frank edema present. No focal airspace disease is present. The visualized soft tissues and bony thorax are unremarkable. IMPRESSION: 1. Low lung volumes. 2. Mild pulmonary vascular congestion without frank edema. Electronically Signed   By: Marin Roberts M.D.   On: 02/02/2023 09:14    Scheduled Meds:  amLODipine  10 mg Oral Daily   baclofen  20 mg Oral BID   Chlorhexidine Gluconate Cloth  6 each Topical Daily   enoxaparin (LOVENOX) injection  40 mg Subcutaneous Q24H   feeding supplement  237 mL Oral BID BM   ferrous sulfate  325 mg Oral Q  breakfast   gabapentin  600 mg Oral q morning   And   gabapentin  900 mg Oral Q1200   And   gabapentin  1,500 mg Oral QHS   oxybutynin  10 mg Oral Daily   pantoprazole  40 mg Oral Daily   pravastatin  10 mg Oral Daily   Continuous Infusions:  cefTRIAXone (ROCEPHIN)  IV Stopped (02/02/23 1406)    LOS: 3 days   Shon Hale, MD [facility]   To contact the attending provider between 7A-7P or the covering provider during after hours 7P-7A, please log into the web site [email or web address] and access using universal [place] password for that web site. If you do not have the password, please call the hospital operator.  02/02/2023, 8:00 PM

## 2023-02-03 DIAGNOSIS — A419 Sepsis, unspecified organism: Secondary | ICD-10-CM | POA: Diagnosis not present

## 2023-02-03 DIAGNOSIS — N390 Urinary tract infection, site not specified: Secondary | ICD-10-CM | POA: Diagnosis not present

## 2023-02-03 LAB — BASIC METABOLIC PANEL
Anion gap: 9 (ref 5–15)
BUN: 18 mg/dL (ref 8–23)
CO2: 19 mmol/L — ABNORMAL LOW (ref 22–32)
Calcium: 9 mg/dL (ref 8.9–10.3)
Chloride: 107 mmol/L (ref 98–111)
Creatinine, Ser: 1.44 mg/dL — ABNORMAL HIGH (ref 0.61–1.24)
GFR, Estimated: 53 mL/min — ABNORMAL LOW (ref >60)
Glucose, Bld: 104 mg/dL — ABNORMAL HIGH (ref 70–99)
Potassium: 3.5 mmol/L (ref 3.5–5.1)
Sodium: 135 mmol/L (ref 135–145)

## 2023-02-03 LAB — CBC
HCT: 30.6 % — ABNORMAL LOW (ref 39.0–52.0)
Hemoglobin: 10.5 g/dL — ABNORMAL LOW (ref 13.0–17.0)
MCH: 26.2 pg (ref 26.0–34.0)
MCHC: 34.3 g/dL (ref 30.0–36.0)
MCV: 76.3 fL — ABNORMAL LOW (ref 80.0–100.0)
Platelets: 193 10*3/uL (ref 150–400)
RBC: 4.01 MIL/uL — ABNORMAL LOW (ref 4.22–5.81)
RDW: 14.4 % (ref 11.5–15.5)
WBC: 9.5 10*3/uL (ref 4.0–10.5)
nRBC: 0 % (ref 0.0–0.2)

## 2023-02-03 MED ORDER — ALBUTEROL SULFATE (2.5 MG/3ML) 0.083% IN NEBU: 2.5000 mg | INHALATION_SOLUTION | RESPIRATORY_TRACT | Status: DC | PRN

## 2023-02-03 MED ORDER — POTASSIUM CHLORIDE CRYS ER 20 MEQ PO TBCR
40.0000 meq | EXTENDED_RELEASE_TABLET | Freq: Once | ORAL | Status: AC
  Administered 2023-02-03: 40 meq via ORAL
  Filled 2023-02-03: qty 2

## 2023-02-03 MED ORDER — IPRATROPIUM-ALBUTEROL 0.5-2.5 (3) MG/3ML IN SOLN
3.0000 mL | Freq: Four times a day (QID) | RESPIRATORY_TRACT | Status: DC
  Administered 2023-02-03 – 2023-02-04 (×4): 3 mL via RESPIRATORY_TRACT
  Filled 2023-02-03 (×4): qty 3

## 2023-02-03 MED ORDER — METHYLPREDNISOLONE SODIUM SUCC 40 MG IJ SOLR
40.0000 mg | Freq: Two times a day (BID) | INTRAMUSCULAR | Status: DC
  Administered 2023-02-03 – 2023-02-04 (×2): 40 mg via INTRAVENOUS
  Filled 2023-02-03 (×2): qty 1

## 2023-02-03 MED ORDER — DM-GUAIFENESIN ER 30-600 MG PO TB12
1.0000 | ORAL_TABLET | Freq: Two times a day (BID) | ORAL | Status: DC
  Administered 2023-02-03 – 2023-02-04 (×2): 1 via ORAL
  Filled 2023-02-03 (×2): qty 1

## 2023-02-03 NOTE — TOC Progression Note (Signed)
Transition of Care [facility]) - Progression Note    Patient Details  Name: Perry [name] MRN: [id number] Date of Birth: [birth year]  Transition [facility]) CM/SW Contact  Princella Ion, LCSW Phone Number: 02/03/2023, 3:22 PM  Clinical Narrative:    PT saw pt and recommended No PT follow up. MD reported pt will not d/c today due to hypoxia. May need o2 at time of d/c. TOC following.    Expected Discharge Plan: Home/Self Care Barriers to Discharge: Continued Medical Work up  Expected Discharge Plan and Services In-house Referral: Clinical Social Work Discharge Planning Services: CM Consult   Living arrangements for the past 2 months: Single Family Home Expected Discharge Date: 02/03/23                                     Social Determinants of Health (SDOH) Interventions SDOH Screenings   Food Insecurity: No Food Insecurity (01/30/2023)  Housing: Low Risk  (01/30/2023)  Transportation Needs: No Transportation Needs (01/30/2023)  Utilities: Not At Risk (01/30/2023)  Financial Resource Strain: Low Risk  (07/05/2022)   Received from [facility]  Physical Activity: Insufficiently Active (10/23/2021)   Received from [facility]  Social Connections: Unknown (11/02/2022)   Received from [facility]  Stress: Stress Concern Present (10/23/2021)   Received from [facility]  Tobacco Use: High Risk (01/30/2023)    Readmission Risk Interventions    01/31/2023   11:43 AM  Readmission Risk Prevention Plan  Transportation Screening Complete  Home Care Screening Complete  Medication Review (RN CM) Complete

## 2023-02-03 NOTE — Progress Notes (Signed)
MD Courage states to disregard discharge due to patient needing 2L nasal canula. Will reevaluate tomorrow.

## 2023-02-03 NOTE — Evaluation (Signed)
Physical Therapy Evaluation Patient Details Name: Perry [name] MRN: [id number] DOB: 08/24/1955 Today's Date: 02/03/2023  History of Present Illness  Per NW:GNFAO Hoxit is a 67 y.o. 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  Fever at home 100.5 F,he complained of increased urinary frequency, but denies flank pain, chest pain, shortness of breath, abdominal pain, nausea or vomiting.  Clinical Impression  Pt states he does not desire HH to come to his home.  Pt is close to prior functional level         If plan is discharge home, recommend the following:  None pt has rollator and scooter   Can travel by private vehicle    yes    Equipment Recommendations  none  Recommendations for Other Services   none    Functional Status Assessment  Pt is mod I with bed mobility and ambulation     Precautions / Restrictions Precautions Precautions: None Restrictions Weight Bearing Restrictions: No      Mobility  Bed Mobility Overal bed mobility: Modified Independent                  Transfers Overall transfer level: Modified independent Equipment used: Rolling walker (2 wheels)                    Ambulation/Gait Ambulation/Gait assistance: Modified independent (Device/Increase time) Gait Distance (Feet): 120 Feet Assistive device: Rolling walker (2 wheels) Gait Pattern/deviations: Decreased step length - right, Decreased step length - left   Gait velocity interpretation: <1.31 ft/sec, indicative of household ambulator   General Gait Details: Pt states he has a rollator at home does not need a RW         Pertinent Vitals/Pain Pain Assessment Pain Assessment: No/denies pain    Home Living  Lives with son 2 steps to get into  house but there is a ramp.  Pt uses Rolator outside the home.                         Prior Function  Mod I with increase time to complete activiites                     Extremity/Trunk Assessment        Lower Extremity Assessment Lower Extremity Assessment: Generalized weakness       Communication   Communication Communication: No apparent difficulties Cueing Techniques: Verbal cues  Cognition Arousal: Alert Behavior During Therapy: WFL for tasks assessed/performed Overall Cognitive Status: Within Functional Limits for tasks assessed                                                 Assessment/Plan    PT Assessment Patient does not need any further PT services (Pt states he does not want HH)                   AM-PAC PT "6 Clicks" Mobility  Outcome Measure Help needed turning from your back to your side while in a flat bed without using bedrails?: None Help needed moving from lying on your back to sitting on the side of a flat bed without using  bedrails?: A Little Help needed moving to and from a bed to a chair (including a wheelchair)?: A Little Help needed standing up from a chair using your arms (e.g., wheelchair or bedside chair)?: None Help needed to walk in hospital room?: A Little Help needed climbing 3-5 steps with a railing? : A Little 6 Click Score: 20    End of Session Equipment Utilized During Treatment: Gait belt Activity Tolerance: Patient tolerated treatment well Patient left: Other (comment);with call bell/phone within reach (sitting on scooter with call bell) Nurse Communication: Mobility status PT Visit Diagnosis: Unsteadiness on feet (R26.81)    Time: 1045-1110 PT Time Calculation (min) (ACUTE ONLY): 25 min   Charges:   PT Evaluation $PT Eval Low Complexity: 1 Low PT Treatments $Gait Training: 8-22 mins PT General Charges $$ ACUTE PT VISIT: 1 Visit        Virgina Organ, PT CLT [phone]   02/03/2023, 11:51 AM

## 2023-02-03 NOTE — Progress Notes (Signed)
PROGRESS NOTE    Perry [name]  ONG:[id number] DOB: 12-Mar-1956 DOA: 01/30/2023 PCP: Benjiman Core, MD    Chief Complaint  Patient presents with   Fever    Brief Narrative:  Perry [name] is a 67 y.o. 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  Fever at home 100.5 F,he complained of increased urinary frequency, but denies flank pain, chest pain, shortness of breath, abdominal pain, nausea or vomiting.   ED Course:  In emergency department, he was febrile with a temperature of 103.52F, pulse 101 bpm, BP 126/58, respiratory rate 20/min, O2 sat 94% on room air.  Shortly after arrival to the ED, Patient's O2 sats decreased to high 80s and was provided with supplemental oxygen at 3 LPM.  Patient does not use oxygen at baseline.  Workup in the ED showed WBC 19.0, hemoglobin 11.1, hematocrit 32.2, MCV 13.2, platelets 161, BMP showed sodium 135, potassium 3.4, chloride 106, bicarb 19, glucose 132, BUN 16, creatinine 1.82 (baseline creatinine is 0.9/1.0), albumin 3.3, lactic acid x 2 was normal, urinalysis was positive for UTI.  Blood culture and urine culture pending, SARS contrast was negative.  Chest x-ray showed no active disease. Chest x-ray showed no active disease Patient was treated with Tylenol, IV ceftriaxone was given and IV hydration was provided.  He was admitted for further evaluation and management.  Assessment & Plan:   Principal Problem:   Sepsis due to urinary tract infection (HCC) Active Problems:   Mixed hyperlipidemia   Hypokalemia   Acute respiratory failure with hypoxia (HCC)   Hypoalbuminemia   AKI (acute kidney injury) (HCC)   Essential hypertension   GERD (gastroesophageal reflux disease)   Chronic inflammatory demyelinating  polyneuropathy (HCC)   Urge incontinence   E. coli bacteremia and sepsis secondary to UTI --POA -Urine and blood cultures from 01/29/2023 with E. coli -Continue IV Rocephin, anticipate discharge on p.o. Keflex   Acute respiratory failure with hypoxia -Patient is a smoker suspect component of COPD -Patient was weaned off oxygen initially on 02/02/2024 -On 02/03/2024 patient is found to have O2 sats of 87 to 88% on room air at rest (patient does not use oxygen at baseline). -Recent chest x-ray with pulmonary vascular congestion without frank pulmonary edema -Patient smokes 1 pack/day -Patient did receive IV fluids this admission due to concerns for sepsis and AKI -Treat with Lasix as ordered for presumed CHF as above---echo requested -Give prednisone and bronchodilators for COPD flareup   Acute kidney injury on CKD 3 A- Creatinine 1.82 on admission -Creatinine trended down with hydration Renally adjust medications, avoid nephrotoxic agents/dehydration/hypotension   Hypokalemia -Replaced and normalized   Hypoalbuminemia Albumin 3.3 Protein supplement will be provided   Iron deficiency anemia with microcytosis--patient also has some degree of anemia of CKD -Workup consistent with iron deficiency with serum iron of 12 iron saturation of 6 -Hgb stable -Would benefit from endoluminal evaluation as outpatient   Essential hypertension Continue amlodipine.    GERD Continue Protonix   Mixed hyperlipidemia Continue pravastatin   Urge incontinence Continue oxybutynin   Chronic inflammatory demyelinating polyneuropathy--at baseline ambulates with crutches and walker for short distance, he uses wheelchair for long distance for the last 2 years Continue Neurontin, baclofen   DVT prophylaxis: Subcutaneous Lovenox Code Status: Full code   Antimicrobials:  IV ceftriaxone   Subjective: -No fevers,  generalized weakness persist, -Hypoxia and some dyspnea persist -O2 sats down to  87% on room air--- gentle IV diuresis started  ---steroids and bronchodilators started  Objective: Vitals:   02/03/23 0544 02/03/23 0833 02/03/23 1235 02/03/23 1500  BP: 132/70 125/75 125/73   Pulse: 69  67   Resp: 16  18   Temp: 98.7 F (37.1 C)  99.3 F (37.4 C)   TempSrc: Oral  Oral   SpO2: 96%  93% 94%  Weight:      Height:        Intake/Output Summary (Last 24 hours) at 02/03/2023 1527 Last data filed at 02/03/2023 0549 Gross per 24 hour  Intake 1083.48 ml  Output 1150 ml  Net -66.52 ml    Filed Weights   01/31/23 0430 02/01/23 0451 02/02/23 0513  Weight: 89.5 kg 90.2 kg 87.2 kg    Physical Exam  Gen:- Awake Alert, in no acute distress  HEENT:- [place].AT, No sclera icterus Neck-Supple Neck,No JVD,.  Lungs-improving air movement, faint bibasilar rales CV- S1, S2 normal, RRR Abd-  +ve B.Sounds, Abd Soft, No tenderness,    Extremity/Skin:- No  edema,   good pedal pulses  Psych-affect is appropriate, oriented x3 Neuro-generalized weakness, unsteady gait with balance issues, due to  chronic inflammatory demyelinating polyneuropathynot new, no new focal deficits, no tremors    Data Reviewed: I have personally reviewed following labs and imaging studies  CBC: Recent Labs  Lab 01/30/23 1417 01/31/23 0457 02/01/23 0509 02/02/23 0452 02/03/23 0434  WBC 19.0* 17.0* 17.7* 11.7* 9.5  NEUTROABS 16.0*  --  14.1*  --   --   HGB 11.1* 11.2* 12.2* 11.1* 10.5*  HCT 32.2* 32.9* 35.6* 32.4* 30.6*  MCV 78.2* 78.7* 77.9* 77.0* 76.3*  PLT 161 169 170 164 193   Basic Metabolic Panel: Recent Labs  Lab 01/30/23 1417 01/31/23 0457 02/01/23 0509 02/02/23 0452 02/03/23 0434  NA 135 137 138 136 135  K 3.4* 3.9 3.7 3.5 3.5  CL 106 108 108 107 107  CO2 19* 19* 16* 21* 19*  GLUCOSE 132* 98 100* 113* 104*  BUN 16 18 16 20 18   CREATININE 1.82* 1.66* 1.65* 1.42* 1.44*  CALCIUM 9.2 9.1 9.2 9.0 9.0  MG  --  1.9  --   --   --   PHOS  --  2.1*  --  2.9  --    GFR: Estimated  Creatinine Clearance: 54.4 mL/min (A) (by C-G formula based on SCr of 1.44 mg/dL (H)).  Liver Function Tests: Recent Labs  Lab 01/30/23 1417 01/31/23 0457 02/02/23 0452  AST 25 32  --   ALT 16 19  --   ALKPHOS 67 67  --   BILITOT 0.9 0.9  --   PROT 7.7 8.0  --   ALBUMIN 3.3* 3.2* 2.6*   CBG: Recent Labs  Lab 01/31/23 0729  GLUCAP 102*    Recent Results (from the past 240 hour(s))  SARS Coronavirus 2 by RT PCR (hospital order, performed in [facility] hospital lab) *cepheid single result test* Anterior Nasal Swab     Status: None   Collection Time: 01/30/23 12:55 PM   Specimen: Anterior Nasal Swab  Result Value Ref Range Status   SARS Coronavirus 2 by RT PCR NEGATIVE NEGATIVE Final    Comment: (NOTE) SARS-CoV-2 target nucleic acids are NOT DETECTED.  The SARS-CoV-2 RNA is generally detectable in upper and lower respiratory specimens during the acute phase of infection. The lowest concentration of SARS-CoV-2 viral  copies this assay can detect is 250 copies / mL. A negative result does not preclude SARS-CoV-2 infection and should not be used as the sole basis for treatment or other patient management decisions.  A negative result may occur with improper specimen collection / handling, submission of specimen other than nasopharyngeal swab, presence of viral mutation(s) within the areas targeted by this assay, and inadequate number of viral copies (<250 copies / mL). A negative result must be combined with clinical observations, patient history, and epidemiological information.  Fact Sheet for Patients:   RoadLapTop.co.za  Fact Sheet for Healthcare Providers: http://kim-miller.com/  This test is not yet approved or  cleared by the [place] FDA and has been authorized for detection and/or diagnosis of SARS-CoV-2 by FDA under an Emergency Use Authorization (EUA).  This EUA will remain in effect (meaning this test can be  used) for the duration of the COVID-19 declaration under Section 564(b)(1) of the Act, 21 U.S.C. section 360bbb-3(b)(1), unless the authorization is terminated or revoked sooner.  Performed at [facility], [address] [place], [place] [address]   Blood Culture (routine x 2)     Status: Abnormal   Collection Time: 01/30/23  3:01 PM   Specimen: Left Antecubital; Blood  Result Value Ref Range Status   Specimen Description   Final    LEFT ANTECUBITAL Performed at [facility], [address], [place], [place] [address]    Special Requests   Final    BOTTLES DRAWN AEROBIC AND ANAEROBIC Blood Culture results may not be optimal due to an excessive volume of blood received in culture bottles Performed at [facility], [address], [place], [place] [address]    Culture  Setup Time   Final    AEROBIC BOTTLE ONLY GRAM NEGATIVE RODS CRITICAL VALUE NOTED.  VALUE IS CONSISTENT WITH PREVIOUSLY REPORTED AND CALLED VALUE. ANAEROBIC BOTTLE ALSO Performed at [facility], [address], [place], [place] [address]    Culture (A)  Final    ESCHERICHIA COLI SUSCEPTIBILITIES PERFORMED ON PREVIOUS CULTURE WITHIN THE LAST 5 DAYS. Performed at [facility], [address] [place], [place] [address]    Report Status 02/02/2023 FINAL  Final  Blood Culture (routine x 2)     Status: Abnormal   Collection Time: 01/30/23  3:05 PM   Specimen: Right Antecubital; Blood  Result Value Ref Range Status   Specimen Description   Final    RIGHT ANTECUBITAL Performed at [facility], [address] [place], [place] [address]    Special Requests   Final    BOTTLES DRAWN AEROBIC AND ANAEROBIC Blood Culture results may not be optimal due to an excessive volume of blood received in culture bottles Performed at [facility], [address] [place], [place] [address]    Culture  Setup Time   Final    GRAM NEGATIVE RODS ANAEROBIC BOTTLE Gram Stain Report Called to,Read Back By and Verified With:  KINDLEY,[email or web address]  BY MATTHEWS, B 8.22.2024 CRITICAL RESULT CALLED TO, READ BACK BY AND VERIFIED WITH: PHARMD LORIE P. [id number] FCP  AEROBIC BOTTLE ONLY CRITICAL VALUE NOTED.  VALUE IS CONSISTENT WITH PREVIOUSLY REPORTED AND CALLED VALUE. GRAM NEGATIVE RODS Performed at [facility], [address] [place], [place] [address]    Culture ESCHERICHIA COLI (A)  Final   Report Status 02/02/2023 FINAL  Final   Organism ID, Bacteria ESCHERICHIA COLI  Final      Susceptibility   Escherichia coli - MIC*  AMPICILLIN >=32 RESISTANT Resistant     CEFEPIME <=0.12 SENSITIVE Sensitive     CEFTAZIDIME <=1 SENSITIVE Sensitive     CEFTRIAXONE <=0.25 SENSITIVE Sensitive     CIPROFLOXACIN <=0.25 SENSITIVE Sensitive     GENTAMICIN <=1 SENSITIVE Sensitive     IMIPENEM <=0.25 SENSITIVE Sensitive     TRIMETH/SULFA <=20 SENSITIVE Sensitive     AMPICILLIN/SULBACTAM 16 INTERMEDIATE Intermediate     PIP/TAZO <=4 SENSITIVE Sensitive     * ESCHERICHIA COLI  Blood Culture ID Panel (Reflexed)     Status: Abnormal   Collection Time: 01/30/23  3:05 PM  Result Value Ref Range Status   Enterococcus faecalis NOT DETECTED NOT DETECTED Final   Enterococcus Faecium NOT DETECTED NOT DETECTED Final   Listeria monocytogenes NOT DETECTED NOT DETECTED Final   Staphylococcus species NOT DETECTED NOT DETECTED Final   Staphylococcus aureus (BCID) NOT DETECTED NOT DETECTED Final   Staphylococcus epidermidis NOT DETECTED NOT DETECTED Final   Staphylococcus lugdunensis NOT DETECTED NOT DETECTED Final   Streptococcus species NOT DETECTED NOT DETECTED Final   Streptococcus agalactiae NOT DETECTED NOT DETECTED Final   Streptococcus pneumoniae NOT DETECTED NOT DETECTED Final   Streptococcus pyogenes NOT DETECTED NOT DETECTED Final   A.calcoaceticus-baumannii NOT DETECTED NOT DETECTED Final   Bacteroides fragilis NOT DETECTED NOT DETECTED Final   Enterobacterales DETECTED (A) NOT DETECTED Final    Comment: Enterobacterales  represent a large order of gram negative bacteria, not a single organism. CRITICAL RESULT CALLED TO, READ BACK BY AND VERIFIED WITH: PHARMD LORIE P. [id number] 1012 FCP     Enterobacter cloacae complex NOT DETECTED NOT DETECTED Final   Escherichia coli DETECTED (A) NOT DETECTED Final    Comment: CRITICAL RESULT CALLED TO, READ BACK BY AND VERIFIED WITH: PHARMD LORIE P. [id number] 1012 FCP     Klebsiella aerogenes NOT DETECTED NOT DETECTED Final   Klebsiella oxytoca NOT DETECTED NOT DETECTED Final   Klebsiella pneumoniae NOT DETECTED NOT DETECTED Final   Proteus species NOT DETECTED NOT DETECTED Final   Salmonella species NOT DETECTED NOT DETECTED Final   Serratia marcescens NOT DETECTED NOT DETECTED Final   Haemophilus influenzae NOT DETECTED NOT DETECTED Final   Neisseria meningitidis NOT DETECTED NOT DETECTED Final   Pseudomonas aeruginosa NOT DETECTED NOT DETECTED Final   Stenotrophomonas maltophilia NOT DETECTED NOT DETECTED Final   Candida albicans NOT DETECTED NOT DETECTED Final   Candida auris NOT DETECTED NOT DETECTED Final   Candida glabrata NOT DETECTED NOT DETECTED Final   Candida krusei NOT DETECTED NOT DETECTED Final   Candida parapsilosis NOT DETECTED NOT DETECTED Final   Candida tropicalis NOT DETECTED NOT DETECTED Final   Cryptococcus neoformans/gattii NOT DETECTED NOT DETECTED Final   CTX-M ESBL NOT DETECTED NOT DETECTED Final   Carbapenem resistance IMP NOT DETECTED NOT DETECTED Final   Carbapenem resistance KPC NOT DETECTED NOT DETECTED Final   Carbapenem resistance NDM NOT DETECTED NOT DETECTED Final   Carbapenem resist OXA 48 LIKE NOT DETECTED NOT DETECTED Final   Carbapenem resistance VIM NOT DETECTED NOT DETECTED Final    Comment: Performed at [facility], [address], [place], [place] [address]  Urine Culture     Status: Abnormal   Collection Time: 01/30/23  4:15 PM   Specimen: Urine, Clean Catch  Result Value Ref Range Status   Specimen Description    Final    URINE, CLEAN CATCH Performed at [facility], [address], [place], [place] [address]    Special  Requests   Final    NONE Performed at [facility], [address] [place], [place] [address]    Culture 60,000 COLONIES/mL ESCHERICHIA COLI (A)  Final   Report Status 02/01/2023 FINAL  Final   Organism ID, Bacteria ESCHERICHIA COLI (A)  Final      Susceptibility   Escherichia coli - MIC*    AMPICILLIN >=32 RESISTANT Resistant     CEFAZOLIN <=4 SENSITIVE Sensitive     CEFEPIME <=0.12 SENSITIVE Sensitive     CEFTRIAXONE <=0.25 SENSITIVE Sensitive     CIPROFLOXACIN <=0.25 SENSITIVE Sensitive     GENTAMICIN <=1 SENSITIVE Sensitive     IMIPENEM <=0.25 SENSITIVE Sensitive     NITROFURANTOIN <=16 SENSITIVE Sensitive     TRIMETH/SULFA <=20 SENSITIVE Sensitive     AMPICILLIN/SULBACTAM >=32 RESISTANT Resistant     PIP/TAZO <=4 SENSITIVE Sensitive     * 60,000 COLONIES/mL ESCHERICHIA COLI  MRSA Next Gen by PCR, Nasal     Status: None   Collection Time: 01/30/23  9:20 PM   Specimen: Nasal Mucosa; Nasal Swab  Result Value Ref Range Status   MRSA by PCR Next Gen NOT DETECTED NOT DETECTED Final    Comment: (NOTE) The GeneXpert MRSA Assay (FDA approved for NASAL specimens only), is one component of a comprehensive MRSA colonization surveillance program. It is not intended to diagnose MRSA infection nor to guide or monitor treatment for MRSA infections. Test performance is not FDA approved in patients less than 22 years old. Performed at [facility], [address] [place], [place] [address]     Radiology Studies: [facility] Chest [facility] 1 View  Result Date: 02/02/2023 CLINICAL DATA:  Dyspnea and respiratory abnormalities. EXAM: PORTABLE CHEST 1 VIEW COMPARISON:  One-view chest x-ray 01/30/2023 FINDINGS: The heart size is normal. Lung volumes are low. Mild pulmonary vascular congestion is present. No frank edema present. No focal airspace disease is present. The visualized soft tissues and  bony thorax are unremarkable. IMPRESSION: 1. Low lung volumes. 2. Mild pulmonary vascular congestion without frank edema. Electronically Signed   By: Marin Roberts M.D.   On: 02/02/2023 09:14    Scheduled Meds:  amLODipine  10 mg Oral Daily   baclofen  20 mg Oral BID   Chlorhexidine Gluconate Cloth  6 each Topical Daily   dextromethorphan-guaiFENesin  1 tablet Oral BID   enoxaparin (LOVENOX) injection  40 mg Subcutaneous Q24H   feeding supplement  237 mL Oral BID BM   ferrous sulfate  325 mg Oral Q breakfast   furosemide  20 mg Intravenous Daily   gabapentin  600 mg Oral q morning   And   gabapentin  900 mg Oral Q1200   And   gabapentin  1,500 mg Oral QHS   ipratropium-albuterol  3 mL Nebulization QID   methylPREDNISolone (SOLU-MEDROL) injection  40 mg Intravenous Q12H   oxybutynin  10 mg Oral Daily   pantoprazole  40 mg Oral Daily   pravastatin  10 mg Oral Daily   Continuous Infusions:  cefTRIAXone (ROCEPHIN)  IV 2 g (02/03/23 1520)    LOS: 4 days   Shon Hale, MD [facility]   To contact the attending provider between 7A-7P or the covering provider during after hours 7P-7A, please log into the web site [email or web address] and access using universal [place] password for that web site. If you do not have the password, please call the hospital operator.  02/03/2023, 3:27 PM

## 2023-02-04 ENCOUNTER — Other Ambulatory Visit (HOSPITAL_COMMUNITY): Payer: Self-pay | Admitting: *Deleted

## 2023-02-04 ENCOUNTER — Inpatient Hospital Stay (HOSPITAL_COMMUNITY): Payer: Medicare PPO

## 2023-02-04 DIAGNOSIS — J449 Chronic obstructive pulmonary disease, unspecified: Secondary | ICD-10-CM | POA: Diagnosis present

## 2023-02-04 DIAGNOSIS — Z720 Tobacco use: Secondary | ICD-10-CM | POA: Diagnosis present

## 2023-02-04 DIAGNOSIS — R0609 Other forms of dyspnea: Secondary | ICD-10-CM

## 2023-02-04 DIAGNOSIS — N390 Urinary tract infection, site not specified: Secondary | ICD-10-CM | POA: Diagnosis not present

## 2023-02-04 DIAGNOSIS — A419 Sepsis, unspecified organism: Secondary | ICD-10-CM | POA: Diagnosis not present

## 2023-02-04 LAB — ECHOCARDIOGRAM COMPLETE
AR max vel: 2.04 cm2
AV Area VTI: 1.87 cm2
AV Area mean vel: 2.15 cm2
AV Mean grad: 9 mmHg
AV Peak grad: 18 mmHg
Ao pk vel: 2.12 m/s
Area-P 1/2: 3.85 cm2
Height: 69 in
S' Lateral: 2.8 cm
Weight: 3075.86 oz

## 2023-02-04 MED ORDER — AMLODIPINE BESYLATE 10 MG PO TABS: 10.0000 mg | ORAL_TABLET | Freq: Every day | ORAL | 5 refills | Status: AC

## 2023-02-04 MED ORDER — FERROUS SULFATE 325 (65 FE) MG PO TABS: 325.0000 mg | ORAL_TABLET | Freq: Every day | ORAL | 3 refills | Status: AC

## 2023-02-04 MED ORDER — GUAIFENESIN ER 600 MG PO TB12: 600.0000 mg | ORAL_TABLET | Freq: Two times a day (BID) | ORAL | 2 refills | Status: AC

## 2023-02-04 MED ORDER — COMPRESSOR/NEBULIZER MISC: 1.0000 [IU] | Freq: Every day | 0 refills | Status: AC | PRN

## 2023-02-04 MED ORDER — OMEPRAZOLE 20 MG PO CPDR: 20.0000 mg | DELAYED_RELEASE_CAPSULE | Freq: Every day | ORAL | 1 refills | Status: AC

## 2023-02-04 MED ORDER — DOXYCYCLINE HYCLATE 100 MG PO TABS: 100.0000 mg | ORAL_TABLET | Freq: Two times a day (BID) | ORAL | 0 refills | Status: AC

## 2023-02-04 MED ORDER — ACETAMINOPHEN 325 MG PO TABS: 650.0000 mg | ORAL_TABLET | Freq: Four times a day (QID) | ORAL | Status: AC | PRN

## 2023-02-04 MED ORDER — GABAPENTIN 300 MG PO CAPS: 300.0000 mg | ORAL_CAPSULE | Freq: Four times a day (QID) | ORAL | 5 refills | Status: AC

## 2023-02-04 MED ORDER — OXYBUTYNIN CHLORIDE ER 10 MG PO TB24: 10.0000 mg | ORAL_TABLET | Freq: Every day | ORAL | 2 refills | Status: AC

## 2023-02-04 MED ORDER — CEPHALEXIN 500 MG PO CAPS: 500.0000 mg | ORAL_CAPSULE | Freq: Three times a day (TID) | ORAL | 0 refills | Status: AC

## 2023-02-04 MED ORDER — CEPHALEXIN 500 MG PO CAPS: 500.0000 mg | ORAL_CAPSULE | Freq: Three times a day (TID) | ORAL | 0 refills | Status: DC

## 2023-02-04 MED ORDER — PRAVASTATIN SODIUM 10 MG PO TABS: 10.0000 mg | ORAL_TABLET | Freq: Every day | ORAL | 2 refills | Status: AC

## 2023-02-04 MED ORDER — ALBUTEROL SULFATE (2.5 MG/3ML) 0.083% IN NEBU: 2.5000 mg | INHALATION_SOLUTION | RESPIRATORY_TRACT | 12 refills | Status: AC | PRN

## 2023-02-04 MED ORDER — PREDNISONE 20 MG PO TABS: 40.0000 mg | ORAL_TABLET | Freq: Every day | ORAL | 0 refills | Status: AC

## 2023-02-04 MED ORDER — PROMETHAZINE HCL 25 MG PO TABS: 25.0000 mg | ORAL_TABLET | Freq: Three times a day (TID) | ORAL | 1 refills | Status: AC | PRN

## 2023-02-04 MED ORDER — FUROSEMIDE 20 MG PO TABS: 20.0000 mg | ORAL_TABLET | Freq: Every day | ORAL | 0 refills | Status: AC

## 2023-02-04 MED ORDER — NICOTINE 21 MG/24HR TD PT24: 21.0000 mg | MEDICATED_PATCH | TRANSDERMAL | 0 refills | Status: AC

## 2023-02-04 NOTE — Discharge Instructions (Signed)
1) complete abstinence from tobacco advised 2) please take medications as prescribed 3) please follow-up with primary care physician in 1 to 2 weeks for recheck and reevaluation

## 2023-02-04 NOTE — Consult Note (Signed)
   [facility] Inpatient Consult   02/04/2023  Jahlil Blanke [birth year] [id number]     Location: [facility] Liaison met with pt via bedside Jeani Hawking).   [facility] Customer service manager [facility]) Accountable Care Organization [ACO] Patient: Chemical engineer).    Primary Care Provider: [facility]  Patient screened for readmission hospitalization with noted medium risk score for unplanned readmission risk with 1 IP in 6 months. [facility]/Population Health RN liaison will assess for potential [facility]) Care Management service needs for post hospital transition for care coordination. Pt was admitted for Sepsis (UTI). Pt followed by VA benefits with no other primary provider. No anticipated needs for care management services.   [facility] Care Management/Population Health does not replace or interfere with any arrangements made by the Inpatient Transition of Care team.   For questions contact:   Elliot Cousin, RN, [facility] Liaison [place]   Population Health Office Hours MTWF  8:00 am-6:00 pm [phone] mobile [phone] [Office toll free line] Office Hours are M-F 8:30 - 5 pm [email or web address]

## 2023-02-04 NOTE — TOC Transition Note (Addendum)
Transition of Care [facility]) - CM/SW Discharge Note   Patient Details  Name: Perry [name] MRN: [id number] Date of Birth: 11-25-1955  Transition of Care [facility] CM/SW Contact:  Leitha Bleak, RN Phone Number: 02/04/2023, 11:57 AM   Clinical Narrative:   Discharging home, no needs, oxygen sat remaining  stable at 96% on Room air.   Addendum: MD ordering a neb machine. TOC sent referral to [facility] with [employer] to be delivered to the room.  RN updated   Barriers to Discharge: No Barriers Identified   Patient Goals and CMS Choice CMS Medicare.gov Compare Post Acute Care list provided to:: Patient Choice offered to / list presented to : Patient  Discharge Placement       Discharge Plan and Services Additional resources added to the After Visit Summary for   In-house Referral: Clinical Social Work Discharge Planning Services: CM Consult                Social Determinants of Health (SDOH) Interventions SDOH Screenings   Food Insecurity: No Food Insecurity (01/30/2023)  Housing: Low Risk  (01/30/2023)  Transportation Needs: No Transportation Needs (01/30/2023)  Utilities: Not At Risk (01/30/2023)  Financial Resource Strain: Low Risk  (07/05/2022)   Received from [facility]  Physical Activity: Insufficiently Active (10/23/2021)   Received from [facility]  Social Connections: Unknown (11/02/2022)   Received from [facility]  Stress: Stress Concern Present (10/23/2021)   Received from [facility]  Tobacco Use: High Risk (01/30/2023)    Readmission Risk Interventions    01/31/2023   11:43 AM  Readmission Risk Prevention Plan  Transportation Screening Complete  Home Care Screening Complete  Medication Review (RN CM) Complete

## 2023-02-04 NOTE — Care Management Important Message (Signed)
Important Message  Patient Details  Name: Perry [name] MRN: [id number] Date of Birth: 06/22/1955   Medicare Important Message Given:  Yes     [name] 02/04/2023, 9:47 AM

## 2023-02-04 NOTE — Progress Notes (Signed)
*  PRELIMINARY RESULTS* Echocardiogram 2D Echocardiogram has not been performed at this time. Patient in bathroom. Waited approximately 10 mins. Will check back later.  Stacey Drain 02/04/2023, 9:07 AM

## 2023-02-04 NOTE — Progress Notes (Signed)
Patient found on room air upon entering room. RT checked patient sats and they were 96%. Left patient on room air.

## 2023-02-04 NOTE — Progress Notes (Signed)
*  PRELIMINARY RESULTS* Echocardiogram 2D Echocardiogram has been performed.  Stacey Drain 02/04/2023, 1:02 PM

## 2023-02-04 NOTE — Care Management Important Message (Signed)
Important Message  Patient Details  Name: Perry [name] MRN: [id number] Date of Birth: Aug 12, 1955   Medicare Important Message Given:  Yes     [name] 02/04/2023, 10:23 AM

## 2023-02-04 NOTE — Discharge Summary (Signed)
Perry [name], is a 67 y.o. male  DOB 12/08/1955  MRN [id number].  Admission date:  01/30/2023  Admitting Physician  Frankey Shown, DO  Discharge Date:  02/04/2023   Primary MD  Benjiman Core, MD  Recommendations for primary care physician for things to follow:   1) complete abstinence from tobacco advised 2) please take medications as prescribed 3) please follow-up with primary care physician in 1 to 2 weeks for recheck and reevaluation  Admission Diagnosis  Sepsis due to urinary tract infection (HCC) [A41.9, N39.0]  Discharge Diagnosis  Sepsis due to urinary tract infection (HCC) [A41.9, N39.0]    Principal Problem:   Sepsis due to urinary tract infection (HCC) Active Problems:   Tobacco abuse   COPD (chronic obstructive pulmonary disease) (HCC)   Mixed hyperlipidemia   Hypokalemia   Acute respiratory failure with hypoxia (HCC)   Hypoalbuminemia   AKI (acute kidney injury) (HCC)   Essential hypertension   GERD (gastroesophageal reflux disease)   Chronic inflammatory demyelinating polyneuropathy (HCC)   Urge incontinence      Past Medical History:  Diagnosis Date   Abnormal gait    uses two sticks to walk   Bilateral carotid artery stenosis    20-39%   Claudication, intermittent (HCC)    Complication of anesthesia    "hard to wake and confused"   GERD (gastroesophageal reflux disease)    Hiatal hernia    History of prostate cancer    08-07-2010  s/p  radical prostatectomy by dr Laverle Patter   Hyperlipidemia    Hypertension    Lower urinary tract symptoms (LUTS)    Neuropathy, peripheral    Retained ureteral stent    right side   Right renal mass    09-22-2015 per MRI consistant with renal cell carcinoma   Right ureteral stone    Wears glasses     Past Surgical History:  Procedure Laterality Date   COLONOSCOPY  04/25/2007   CYSTOSCOPY W/ URETERAL STENT PLACEMENT Right  01/12/2016   Procedure: CYSTOSCOPY WITH RETROGRADE PYELOGRAM/URETERAL STENT PLACEMENT;  Surgeon: Ihor Gully, MD;  Location: [facility];  Service: Urology;  Laterality: Right;   CYSTOSCOPY/URETEROSCOPY/HOLMIUM LASER/STENT PLACEMENT Right 02/20/2016   Procedure: CYSTOSCOPY/URETEROSCOPY/HOLMIUM LASER/STENT PLACEMENT AND REMOVAL OF STENT;  Surgeon: Ihor Gully, MD;  Location: [facility];  Service: Urology;  Laterality: Right;   ROBOT ASSISTED LAPAROSCOPIC RADICAL PROSTATECTOMY  08-07-2010  dr borden     HPI  from the history and physical done on the day of admission:    HPI: Perry [name] is a 67 y.o. 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  Fever at home 100.5 F,he complained of increased urinary frequency, but denies flank pain, chest pain, shortness of breath, abdominal pain, nausea or vomiting.   ED Course:  In emergency department, he was febrile with a temperature of 103.25F, pulse 101 bpm,  BP 126/58, respiratory rate 20/min, O2 sat 94% on room air.  Shortly after arrival to the ED, Patient's O2 sats decreased to high 80s and was provided with supplemental oxygen at 3 LPM.  Patient does not use oxygen at baseline.  Workup in the ED showed WBC 19.0, hemoglobin 11.1, hematocrit 32.2, MCV 13.2, platelets 161, BMP showed sodium 135, potassium 3.4, chloride 106, bicarb 19, glucose 132, BUN 16, creatinine 1.82 (baseline creatinine is 0.9/1.0), albumin 3.3, lactic acid x 2 was normal, urinalysis was positive for UTI.  Blood culture and urine culture pending, SARS contrast was negative.  Chest x-ray showed no active disease. Chest x-ray showed no active disease Patient was treated with Tylenol, IV ceftriaxone was given and IV hydration was provided.  Hospitalist was  asked to admit patient for further evaluation and management.   Review of Systems: Review of systems as noted in the HPI. All other systems reviewed and are negative.   Hospital Course:   1)E. coli bacteremia and sepsis secondary to UTI --POA -Urine and blood cultures from 01/29/2023 with E. coli -Treated with IV Rocephin, discharge on p.o. Keflex   2)Acute respiratory failure with hypoxia -Patient is a smoker suspect component of COPD -Patient has been weaned off oxygen  -Recent chest x-ray with pulmonary vascular congestion without frank pulmonary edema -Patient smokes 1 pack/day -Patient did receive IV fluids this admission due to concerns for sepsis and AKI -Treated with Lasix  --echo shows EF of 60 % to 65 %, no AS, no MS, no other dysfunction -Much improved COPD flareup -Hypoxia has resolved patient is on room air -c/n Prednisone and bronchodilators  -Keflex and doxycycline as ordered   Acute kidney injury on CKD 3 A- Creatinine 1.82 on admission -Creatinine is down to 1.44    Hypokalemia -Replaced and normalized   Hypoalbuminemia Albumin 3.3 Protein supplement will be provided   Iron deficiency anemia with microcytosis--patient also has some degree of anemia of CKD -Workup consistent with iron deficiency with serum iron of 12 iron saturation of 6 -Hgb stable -Continue iron supplementation -Would benefit from endoluminal evaluation as outpatient   Essential hypertension Continue amlodipine.     GERD Continue Protonix   Mixed hyperlipidemia Continue pravastatin   Urge incontinence Continue oxybutynin   Chronic inflammatory demyelinating polyneuropathy--at baseline ambulates with crutches and walker for short distance, he uses wheelchair for long distance for the last 2 years Continue Neurontin, baclofen  Discharge Condition: stable  Follow UP   Follow-up Information     [facility] Follow up.   Why: Neb machine will be  delivered to the room Contact information: [address] [address] [phone]                 Diet and Activity recommendation:  As advised  Discharge Instructions    Discharge Instructions     Ambulatory Referral for Lung Cancer Scre   Complete by: As directed    Call MD for:  difficulty breathing, headache or visual disturbances   Complete by: As directed    Call MD for:  persistant dizziness or light-headedness   Complete by: As directed    Call MD for:  persistant nausea and vomiting   Complete by: As directed    Call MD for:  temperature >100.4   Complete by: As directed    Diet - low sodium heart healthy   Complete by: As directed    Discharge instructions   Complete by:  As directed    1) complete abstinence from tobacco advised 2) please take medications as prescribed 3) please follow-up with primary care physician in 1 to 2 weeks for recheck and reevaluation   For home use only DME Nebulizer machine   Complete by: As directed    Patient needs a nebulizer to treat with the following condition: COPD (chronic obstructive pulmonary disease) (HCC)   Length of Need: Lifetime   Increase activity slowly   Complete by: As directed         Discharge Medications     Allergies as of 02/04/2023       Reactions   Aspirin Other (See Comments)   Unsure reaction        Medication List     TAKE these medications    acetaminophen 325 MG tablet Commonly known as: TYLENOL Take 2 tablets (650 mg total) by mouth every 6 (six) hours as needed for mild pain (or Fever >/= 101).   albuterol (2.5 MG/3ML) 0.083% nebulizer solution Commonly known as: PROVENTIL Take 3 mLs (2.5 mg total) by nebulization every 2 (two) hours as needed for wheezing or shortness of breath.   amLODipine 10 MG tablet Commonly known as: NORVASC Take 1 tablet (10 mg total) by mouth daily. What changed: when to take this   baclofen 10 MG tablet Commonly known as:  LIORESAL Take 20 mg by mouth 2 (two) times daily.   Calcium Citrate-Vitamin D 315-5 MG-MCG Tabs Take 1 tablet by mouth in the morning and at bedtime.   cephALEXin 500 MG capsule Commonly known as: Keflex Take 1 capsule (500 mg total) by mouth 3 (three) times daily for 10 days.   Cholecalciferol 25 MCG (1000 UT) tablet Take 1,000 Units by mouth daily.   Compressor/Nebulizer Misc 1 Units by Does not apply route daily as needed.   cyanocobalamin 1000 MCG tablet Take 1,000 mcg by mouth daily.   doxycycline 100 MG tablet Commonly known as: VIBRA-TABS Take 1 tablet (100 mg total) by mouth 2 (two) times daily for 5 days.   ferrous sulfate 325 (65 FE) MG tablet Take 1 tablet (325 mg total) by mouth daily with breakfast. Start taking on: February 05, 2023   furosemide 20 MG tablet Commonly known as: Lasix Take 1 tablet (20 mg total) by mouth daily.   gabapentin 300 MG capsule Commonly known as: NEURONTIN Take 1 capsule (300 mg total) by mouth 4 (four) times daily. What changed:  when to take this additional instructions   guaiFENesin 600 MG 12 hr tablet Commonly known as: Mucinex Take 1 tablet (600 mg total) by mouth 2 (two) times daily.   Meclizine HCl 25 MG Chew Chew 1 tablet by mouth every 6 (six) hours as needed (vertigo).   niacin 500 MG tablet Commonly known as: VITAMIN B3 Take 500 mg by mouth See admin instructions. Take 500 mg by mouth daily with breakfast and 1000 mg at bedtime.   nicotine 21 mg/24hr patch Commonly known as: NICODERM CQ - dosed in mg/24 hours Place 1 patch (21 mg total) onto the skin daily for 28 days.   omeprazole 20 MG capsule Commonly known as: PRILOSEC Take 1 capsule (20 mg total) by mouth daily.   oxybutynin 10 MG 24 hr tablet Commonly known as: DITROPAN-XL Take 1 tablet (10 mg total) by mouth daily.   pravastatin 10 MG tablet Commonly known as: PRAVACHOL Take 1 tablet (10 mg total) by mouth daily.   predniSONE 20 MG  tablet Commonly  known as: DELTASONE Take 2 tablets (40 mg total) by mouth daily with breakfast for 5 days.   promethazine 25 MG tablet Commonly known as: PHENERGAN Take 1 tablet (25 mg total) by mouth every 8 (eight) hours as needed for nausea or vomiting. What changed:  when to take this reasons to take this               Durable Medical Equipment  (From admission, onward)           Start     Ordered   02/04/23 0000  For home use only DME Nebulizer machine       Question Answer Comment  Patient needs a nebulizer to treat with the following condition COPD (chronic obstructive pulmonary disease) (HCC)   Length of Need Lifetime      02/04/23 1423            Major procedures and Radiology Reports - PLEASE review detailed and final reports for all details, in brief -   ECHOCARDIOGRAM COMPLETE  Result Date: 02/04/2023    ECHOCARDIOGRAM REPORT   Patient Name:   Perry [name] Date of Exam: 02/04/2023 Medical Rec #:  [id number]    Height:       69.0 in Accession #:    [id number]   Weight:       192.2 lb Date of Birth:  May 11, 1956   BSA:          2.032 m Patient Age:    66 years     BP:           125/73 mmHg Patient Gender: M            HR:           55 bpm. [facility] Procedure: 2D Echo, Cardiac Doppler and Color Doppler Indications:    Dyspnea R06.00  History:        Patient has prior history of Echocardiogram examinations, most                 recent 07/21/2009. Risk Factors:Hypertension and Dyslipidemia.                 Sepsis due to urinary tract infection (HCC),                 Malignant neoplasm of prostate (HCC).  Sonographer:    Celesta Gentile RCS Referring Phys: [phone] Jeylin Woodmansee IMPRESSIONS  1. Left ventricular ejection fraction, by estimation, is 60 to 65%. The left ventricle has normal function. The left ventricle has no regional wall motion abnormalities. Left ventricular diastolic parameters were normal.  2. Right ventricular systolic function is  normal. The right ventricular size is normal.  3. The mitral valve is normal in structure. No evidence of mitral valve regurgitation. No evidence of mitral stenosis.  4. The aortic valve is tricuspid. Aortic valve regurgitation is not visualized. No aortic stenosis is present.  5. The inferior vena cava is normal in size with greater than 50% respiratory variability, suggesting right atrial pressure of 3 mmHg. FINDINGS  Left Ventricle: Left ventricular ejection fraction, by estimation, is 60 to 65%. The left ventricle has normal function. The left ventricle has no regional wall motion abnormalities. The left ventricular internal cavity size was normal in size. There is  no left ventricular hypertrophy. Left ventricular diastolic parameters were normal. Right Ventricle: The right ventricular size is normal. Right vetricular wall thickness was not well visualized. Right ventricular systolic function is  normal. Left Atrium: Left atrial size was normal in size. Right Atrium: Right atrial size was normal in size. Pericardium: There is no evidence of pericardial effusion. Mitral Valve: The mitral valve is normal in structure. No evidence of mitral valve regurgitation. No evidence of mitral valve stenosis. Tricuspid Valve: The tricuspid valve is normal in structure. Tricuspid valve regurgitation is not demonstrated. No evidence of tricuspid stenosis. Aortic Valve: The aortic valve is tricuspid. Aortic valve regurgitation is not visualized. No aortic stenosis is present. Aortic valve mean gradient measures 9.0 mmHg. Aortic valve peak gradient measures 18.0 mmHg. Aortic valve area, by VTI measures 1.87  cm. Pulmonic Valve: The pulmonic valve was not well visualized. Pulmonic valve regurgitation is not visualized. No evidence of pulmonic stenosis. Aorta: The aortic root is normal in size and structure. Venous: The inferior vena cava is normal in size with greater than 50% respiratory variability, suggesting right atrial  pressure of 3 mmHg. IAS/Shunts: No atrial level shunt detected by color flow Doppler.  LEFT VENTRICLE PLAX 2D LVIDd:         4.90 cm   Diastology LVIDs:         2.80 cm   LV e' medial:    8.92 cm/s LV PW:         1.10 cm   LV E/e' medial:  10.1 LV IVS:        0.90 cm   LV e' lateral:   10.15 cm/s LVOT diam:     2.00 cm   LV E/e' lateral: 8.9 LV SV:         83 LV SV Index:   41 LVOT Area:     3.14 cm  RIGHT VENTRICLE RV S prime:     13.70 cm/s TAPSE (M-mode): 2.5 cm LEFT ATRIUM             Index        RIGHT ATRIUM           Index LA diam:        3.80 cm 1.87 cm/m   RA Area:     17.30 cm LA Vol (A2C):   60.1 ml 29.58 ml/m  RA Volume:   50.70 ml  24.96 ml/m LA Vol (A4C):   59.5 ml 29.29 ml/m LA Biplane Vol: 61.8 ml 30.42 ml/m  AORTIC VALVE AV Area (Vmax):    2.04 cm AV Area (Vmean):   2.15 cm AV Area (VTI):     1.87 cm AV Vmax:           212.00 cm/s AV Vmean:          131.000 cm/s AV VTI:            0.444 m AV Peak Grad:      18.0 mmHg AV Mean Grad:      9.0 mmHg LVOT Vmax:         138.00 cm/s LVOT Vmean:        89.800 cm/s LVOT VTI:          0.264 m LVOT/AV VTI ratio: 0.59  AORTA Ao Root diam: 3.00 cm MITRAL VALVE MV Area (PHT): 3.85 cm    SHUNTS MV Decel Time: 197 msec    Systemic VTI:  0.26 m MV E velocity: 90.10 cm/s  Systemic Diam: 2.00 cm MV A velocity: 71.30 cm/s MV E/A ratio:  1.26 Dina Rich MD Electronically signed by Dina Rich MD Signature Date/Time: 02/04/2023/1:44:46 PM    Final    DG  Chest Port 1 View  Result Date: 02/02/2023 CLINICAL DATA:  Dyspnea and respiratory abnormalities. EXAM: PORTABLE CHEST 1 VIEW COMPARISON:  One-view chest x-ray 01/30/2023 FINDINGS: The heart size is normal. Lung volumes are low. Mild pulmonary vascular congestion is present. No frank edema present. No focal airspace disease is present. The visualized soft tissues and bony thorax are unremarkable. IMPRESSION: 1. Low lung volumes. 2. Mild pulmonary vascular congestion without frank edema.  Electronically Signed   By: Marin Roberts M.D.   On: 02/02/2023 09:14   DG Chest 1 View  Result Date: 01/30/2023 CLINICAL DATA:  Shortness of breath EXAM: CHEST  1 VIEW COMPARISON:  Radiograph 01/30/2023 FINDINGS: Stable cardiomediastinal silhouette. Aortic atherosclerotic calcification. No focal consolidation, pleural effusion, or pneumothorax. No displaced rib fractures. IMPRESSION: No acute cardiopulmonary disease. Electronically Signed   By: Minerva Fester M.D.   On: 01/30/2023 21:41   DG Chest Portable 1 View  Result Date: 01/30/2023 CLINICAL DATA:  Fever. EXAM: PORTABLE CHEST 1 VIEW COMPARISON:  January 12, 2016. FINDINGS: The heart size and mediastinal contours are within normal limits. Both lungs are clear. The visualized skeletal structures are unremarkable. IMPRESSION: No active disease. Electronically Signed   By: Lupita Raider M.D.   On: 01/30/2023 15:35    Micro Results  Recent Results (from the past 240 hour(s))  SARS Coronavirus 2 by RT PCR (hospital order, performed in [facility] hospital lab) *cepheid single result test* Anterior Nasal Swab     Status: None   Collection Time: 01/30/23 12:55 PM   Specimen: Anterior Nasal Swab  Result Value Ref Range Status   SARS Coronavirus 2 by RT PCR NEGATIVE NEGATIVE Final    Comment: (NOTE) SARS-CoV-2 target nucleic acids are NOT DETECTED.  The SARS-CoV-2 RNA is generally detectable in upper and lower respiratory specimens during the acute phase of infection. The lowest concentration of SARS-CoV-2 viral copies this assay can detect is 250 copies / mL. A negative result does not preclude SARS-CoV-2 infection and should not be used as the sole basis for treatment or other patient management decisions.  A negative result may occur with improper specimen collection / handling, submission of specimen other than nasopharyngeal swab, presence of viral mutation(s) within the areas targeted by this assay, and inadequate number of  viral copies (<250 copies / mL). A negative result must be combined with clinical observations, patient history, and epidemiological information.  Fact Sheet for Patients:   RoadLapTop.co.za  Fact Sheet for Healthcare Providers: http://kim-miller.com/  This test is not yet approved or  cleared by the [place] FDA and has been authorized for detection and/or diagnosis of SARS-CoV-2 by FDA under an Emergency Use Authorization (EUA).  This EUA will remain in effect (meaning this test can be used) for the duration of the COVID-19 declaration under Section 564(b)(1) of the Act, 21 U.S.C. section 360bbb-3(b)(1), unless the authorization is terminated or revoked sooner.  Performed at [facility], [address] [place], [place] [address]   Blood Culture (routine x 2)     Status: Abnormal   Collection Time: 01/30/23  3:01 PM   Specimen: Left Antecubital; Blood  Result Value Ref Range Status   Specimen Description   Final    LEFT ANTECUBITAL Performed at [facility], [address] [place], [place] [address]    Special Requests   Final    BOTTLES DRAWN AEROBIC AND ANAEROBIC Blood Culture results may not be optimal due to an excessive volume of blood received in culture  bottles Performed at [facility], [address] [place], [place] [address]    Culture  Setup Time   Final    AEROBIC BOTTLE ONLY GRAM NEGATIVE RODS CRITICAL VALUE NOTED.  VALUE IS CONSISTENT WITH PREVIOUSLY REPORTED AND CALLED VALUE. ANAEROBIC BOTTLE ALSO Performed at [facility], [address], [place], [place] [address]    Culture (A)  Final    ESCHERICHIA COLI SUSCEPTIBILITIES PERFORMED ON PREVIOUS CULTURE WITHIN THE LAST 5 DAYS. Performed at [facility], [address] [place], [place] [address]    Report Status 02/02/2023 FINAL  Final  Blood Culture (routine x 2)     Status: Abnormal   Collection Time: 01/30/23  3:05 PM   Specimen: Right  Antecubital; Blood  Result Value Ref Range Status   Specimen Description   Final    RIGHT ANTECUBITAL Performed at [facility], [address], [place], [place] [address]    Special Requests   Final    BOTTLES DRAWN AEROBIC AND ANAEROBIC Blood Culture results may not be optimal due to an excessive volume of blood received in culture bottles Performed at [facility], [address], [place], [place] [address]    Culture  Setup Time   Final    GRAM NEGATIVE RODS ANAEROBIC BOTTLE Gram Stain Report Called to,Read Back By and Verified With: [email or web address]  BY MATTHEWS, B 8.22.2024 CRITICAL RESULT CALLED TO, READ BACK BY AND VERIFIED WITH: PHARMD LORIE P. [id number] FCP  AEROBIC BOTTLE ONLY CRITICAL VALUE NOTED.  VALUE IS CONSISTENT WITH PREVIOUSLY REPORTED AND CALLED VALUE. GRAM NEGATIVE RODS Performed at [facility], [address], [place], [place] [address]    Culture ESCHERICHIA COLI (A)  Final   Report Status 02/02/2023 FINAL  Final   Organism ID, Bacteria ESCHERICHIA COLI  Final      Susceptibility   Escherichia coli - MIC*    AMPICILLIN >=32 RESISTANT Resistant     CEFEPIME <=0.12 SENSITIVE Sensitive     CEFTAZIDIME <=1 SENSITIVE Sensitive     CEFTRIAXONE <=0.25 SENSITIVE Sensitive     CIPROFLOXACIN <=0.25 SENSITIVE Sensitive     GENTAMICIN <=1 SENSITIVE Sensitive     IMIPENEM <=0.25 SENSITIVE Sensitive     TRIMETH/SULFA <=20 SENSITIVE Sensitive     AMPICILLIN/SULBACTAM 16 INTERMEDIATE Intermediate     PIP/TAZO <=4 SENSITIVE Sensitive     * ESCHERICHIA COLI  Blood Culture ID Panel (Reflexed)     Status: Abnormal   Collection Time: 01/30/23  3:05 PM  Result Value Ref Range Status   Enterococcus faecalis NOT DETECTED NOT DETECTED Final   Enterococcus Faecium NOT DETECTED NOT DETECTED Final   Listeria monocytogenes NOT DETECTED NOT DETECTED Final   Staphylococcus species NOT DETECTED NOT DETECTED Final   Staphylococcus aureus (BCID) NOT DETECTED NOT DETECTED Final    Staphylococcus epidermidis NOT DETECTED NOT DETECTED Final   Staphylococcus lugdunensis NOT DETECTED NOT DETECTED Final   Streptococcus species NOT DETECTED NOT DETECTED Final   Streptococcus agalactiae NOT DETECTED NOT DETECTED Final   Streptococcus pneumoniae NOT DETECTED NOT DETECTED Final   Streptococcus pyogenes NOT DETECTED NOT DETECTED Final   A.calcoaceticus-baumannii NOT DETECTED NOT DETECTED Final   Bacteroides fragilis NOT DETECTED NOT DETECTED Final   Enterobacterales DETECTED (A) NOT DETECTED Final    Comment: Enterobacterales represent a large order of gram negative bacteria, not a single organism. CRITICAL RESULT CALLED TO, READ BACK BY AND VERIFIED WITH: PHARMD LORIE P. [id number] FCP     Enterobacter cloacae complex NOT  DETECTED NOT DETECTED Final   Escherichia coli DETECTED (A) NOT DETECTED Final    Comment: CRITICAL RESULT CALLED TO, READ BACK BY AND VERIFIED WITH: PHARMD LORIE P. [id number] 1012 FCP     Klebsiella aerogenes NOT DETECTED NOT DETECTED Final   Klebsiella oxytoca NOT DETECTED NOT DETECTED Final   Klebsiella pneumoniae NOT DETECTED NOT DETECTED Final   Proteus species NOT DETECTED NOT DETECTED Final   Salmonella species NOT DETECTED NOT DETECTED Final   Serratia marcescens NOT DETECTED NOT DETECTED Final   Haemophilus influenzae NOT DETECTED NOT DETECTED Final   Neisseria meningitidis NOT DETECTED NOT DETECTED Final   Pseudomonas aeruginosa NOT DETECTED NOT DETECTED Final   Stenotrophomonas maltophilia NOT DETECTED NOT DETECTED Final   Candida albicans NOT DETECTED NOT DETECTED Final   Candida auris NOT DETECTED NOT DETECTED Final   Candida glabrata NOT DETECTED NOT DETECTED Final   Candida krusei NOT DETECTED NOT DETECTED Final   Candida parapsilosis NOT DETECTED NOT DETECTED Final   Candida tropicalis NOT DETECTED NOT DETECTED Final   Cryptococcus neoformans/gattii NOT DETECTED NOT DETECTED Final   CTX-M ESBL NOT DETECTED NOT DETECTED Final    Carbapenem resistance IMP NOT DETECTED NOT DETECTED Final   Carbapenem resistance KPC NOT DETECTED NOT DETECTED Final   Carbapenem resistance NDM NOT DETECTED NOT DETECTED Final   Carbapenem resist OXA 48 LIKE NOT DETECTED NOT DETECTED Final   Carbapenem resistance VIM NOT DETECTED NOT DETECTED Final    Comment: Performed at [facility], [address] [place], [place] [address]  Urine Culture     Status: Abnormal   Collection Time: 01/30/23  4:15 PM   Specimen: Urine, Clean Catch  Result Value Ref Range Status   Specimen Description   Final    URINE, CLEAN CATCH Performed at [facility], [address], [place], [place] [address]    Special Requests   Final    NONE Performed at [facility], [address] [place], [place] [address]    Culture 60,000 COLONIES/mL ESCHERICHIA COLI (A)  Final   Report Status 02/01/2023 FINAL  Final   Organism ID, Bacteria ESCHERICHIA COLI (A)  Final      Susceptibility   Escherichia coli - MIC*    AMPICILLIN >=32 RESISTANT Resistant     CEFAZOLIN <=4 SENSITIVE Sensitive     CEFEPIME <=0.12 SENSITIVE Sensitive     CEFTRIAXONE <=0.25 SENSITIVE Sensitive     CIPROFLOXACIN <=0.25 SENSITIVE Sensitive     GENTAMICIN <=1 SENSITIVE Sensitive     IMIPENEM <=0.25 SENSITIVE Sensitive     NITROFURANTOIN <=16 SENSITIVE Sensitive     TRIMETH/SULFA <=20 SENSITIVE Sensitive     AMPICILLIN/SULBACTAM >=32 RESISTANT Resistant     PIP/TAZO <=4 SENSITIVE Sensitive     * 60,000 COLONIES/mL ESCHERICHIA COLI  MRSA Next Gen by PCR, Nasal     Status: None   Collection Time: 01/30/23  9:20 PM   Specimen: Nasal Mucosa; Nasal Swab  Result Value Ref Range Status   MRSA by PCR Next Gen NOT DETECTED NOT DETECTED Final    Comment: (NOTE) The GeneXpert MRSA Assay (FDA approved for NASAL specimens only), is one component of a comprehensive MRSA colonization surveillance program. It is not intended to diagnose MRSA infection nor to guide or monitor treatment for MRSA  infections. Test performance is not FDA approved in patients less than 22 years old. Performed at [facility], [address], [place], [place] [address]    Today   Subjective  Perry [name] today has no new complaints  - No Nausea, Vomiting or Diarrhea   Patient has been seen and examined prior to discharge   Objective   Blood pressure 125/73, pulse (!) 52, temperature 98.7 F (37.1 C), resp. rate 20, height 5\' 9"  (1.753 m), weight 87.2 kg, SpO2 96%.   Intake/Output Summary (Last 24 hours) at 02/04/2023 1452 Last data filed at 02/04/2023 1300 Gross per 24 hour  Intake 960 ml  Output 1500 ml  Net -540 ml   Exam Gen:- Awake Alert, no acute distress  HEENT:- [place].AT, No sclera icterus Neck-Supple Neck,No JVD,.  Lungs-improved air movement, no wheezing  CV- S1, S2 normal, regular Abd-  +ve B.Sounds, Abd Soft, No tenderness,    Extremity/Skin:- No  edema,   good pulses Psych-affect is appropriate, oriented x3 Neuro-generalized weakness, unsteady gait with balance issues, due to chronic inflammatory demyelinating polyneuropathynot new, no new focal deficits, no tremors    Data Review   CBC w Diff:  Lab Results  Component Value Date   WBC 9.5 02/03/2023   HGB 10.5 (L) 02/03/2023   HCT 30.6 (L) 02/03/2023   PLT 193 02/03/2023   LYMPHOPCT 11 02/01/2023   MONOPCT 9 02/01/2023   EOSPCT 0 02/01/2023   BASOPCT 0 02/01/2023    CMP:  Lab Results  Component Value Date   NA 135 02/03/2023   K 3.5 02/03/2023   CL 107 02/03/2023   CO2 19 (L) 02/03/2023   BUN 18 02/03/2023   CREATININE 1.44 (H) 02/03/2023   PROT 8.0 01/31/2023   ALBUMIN 2.6 (L) 02/02/2023   BILITOT 0.9 01/31/2023   ALKPHOS 67 01/31/2023   AST 32 01/31/2023   ALT 19 01/31/2023   Total Discharge time is about 33 minutes  Shon Hale M.D on 02/04/2023 at 2:52 PM  Go to [email or web address] -  for contact info  [facility] - Office  [phone]

## 2023-03-07 DIAGNOSIS — J449 Chronic obstructive pulmonary disease, unspecified: Secondary | ICD-10-CM | POA: Diagnosis not present

## 2023-04-06 DIAGNOSIS — J449 Chronic obstructive pulmonary disease, unspecified: Secondary | ICD-10-CM | POA: Diagnosis not present

## 2023-05-07 DIAGNOSIS — J449 Chronic obstructive pulmonary disease, unspecified: Secondary | ICD-10-CM | POA: Diagnosis not present

## 2023-06-06 DIAGNOSIS — J449 Chronic obstructive pulmonary disease, unspecified: Secondary | ICD-10-CM | POA: Diagnosis not present

## 2023-08-07 DIAGNOSIS — J449 Chronic obstructive pulmonary disease, unspecified: Secondary | ICD-10-CM | POA: Diagnosis not present

## 2023-08-23 DIAGNOSIS — Z7689 Persons encountering health services in other specified circumstances: Secondary | ICD-10-CM | POA: Diagnosis not present

## 2023-09-04 DIAGNOSIS — J449 Chronic obstructive pulmonary disease, unspecified: Secondary | ICD-10-CM | POA: Diagnosis not present

## 2023-10-07 DIAGNOSIS — Z7689 Persons encountering health services in other specified circumstances: Secondary | ICD-10-CM | POA: Diagnosis not present

## 2023-11-04 DIAGNOSIS — J449 Chronic obstructive pulmonary disease, unspecified: Secondary | ICD-10-CM | POA: Diagnosis not present

## 2023-11-06 ENCOUNTER — Other Ambulatory Visit (HOSPITAL_COMMUNITY): Payer: Self-pay | Admitting: Otolaryngology

## 2023-11-06 DIAGNOSIS — R490 Dysphonia: Secondary | ICD-10-CM

## 2023-11-12 ENCOUNTER — Encounter (HOSPITAL_COMMUNITY): Payer: Self-pay | Admitting: Radiology

## 2023-11-12 ENCOUNTER — Ambulatory Visit (HOSPITAL_COMMUNITY)
Admission: RE | Admit: 2023-11-12 | Discharge: 2023-11-12 | Disposition: A | Discharge status: Home / Self Care | Source: Ambulatory Visit | Attending: Otolaryngology | Admitting: Otolaryngology

## 2023-11-12 DIAGNOSIS — R490 Dysphonia: Secondary | ICD-10-CM | POA: Insufficient documentation

## 2023-11-12 MED ORDER — IOHEXOL 300 MG/ML  SOLN
75.0000 mL | Freq: Once | INTRAMUSCULAR | Status: AC | PRN
  Administered 2023-11-12: 75 mL via INTRAVENOUS

## 2024-06-06 ENCOUNTER — Other Ambulatory Visit: Payer: Self-pay

## 2024-06-06 ENCOUNTER — Emergency Department (HOSPITAL_COMMUNITY)

## 2024-06-06 ENCOUNTER — Emergency Department (HOSPITAL_COMMUNITY)
Admission: EM | Admit: 2024-06-06 | Discharge: 2024-06-06 | Disposition: A | Discharge status: Home / Self Care | Attending: Emergency Medicine | Admitting: Emergency Medicine

## 2024-06-06 ENCOUNTER — Encounter (HOSPITAL_COMMUNITY): Payer: Self-pay | Admitting: Emergency Medicine

## 2024-06-06 DIAGNOSIS — R1084 Generalized abdominal pain: Secondary | ICD-10-CM | POA: Insufficient documentation

## 2024-06-06 DIAGNOSIS — Z8546 Personal history of malignant neoplasm of prostate: Secondary | ICD-10-CM | POA: Insufficient documentation

## 2024-06-06 DIAGNOSIS — N289 Disorder of kidney and ureter, unspecified: Secondary | ICD-10-CM

## 2024-06-06 DIAGNOSIS — J449 Chronic obstructive pulmonary disease, unspecified: Secondary | ICD-10-CM | POA: Diagnosis not present

## 2024-06-06 LAB — URINALYSIS, ROUTINE W REFLEX MICROSCOPIC
Bacteria, UA: NONE SEEN
Bilirubin Urine: NEGATIVE
Glucose, UA: NEGATIVE mg/dL
Ketones, ur: NEGATIVE mg/dL
Leukocytes,Ua: NEGATIVE
Nitrite: NEGATIVE
Protein, ur: 100 mg/dL — AB
Specific Gravity, Urine: 1.014 (ref 1.005–1.030)
pH: 5 (ref 5.0–8.0)

## 2024-06-06 LAB — COMPREHENSIVE METABOLIC PANEL WITH GFR
ALT: 24 U/L (ref 0–44)
AST: 48 U/L — ABNORMAL HIGH (ref 15–41)
Albumin: 4.6 g/dL (ref 3.5–5.0)
Alkaline Phosphatase: 101 U/L (ref 38–126)
Anion gap: 14 (ref 5–15)
BUN: 11 mg/dL (ref 8–23)
CO2: 19 mmol/L — ABNORMAL LOW (ref 22–32)
Calcium: 10.7 mg/dL — ABNORMAL HIGH (ref 8.9–10.3)
Chloride: 104 mmol/L (ref 98–111)
Creatinine, Ser: 1.2 mg/dL (ref 0.61–1.24)
GFR, Estimated: >60 mL/min
Glucose, Bld: 98 mg/dL (ref 70–99)
Potassium: 3.7 mmol/L (ref 3.5–5.1)
Sodium: 138 mmol/L (ref 135–145)
Total Bilirubin: 0.5 mg/dL (ref 0.0–1.2)
Total Protein: 8.6 g/dL — ABNORMAL HIGH (ref 6.5–8.1)

## 2024-06-06 LAB — CBC
HCT: 40.1 % (ref 39.0–52.0)
Hemoglobin: 13.6 g/dL (ref 13.0–17.0)
MCH: 26.4 pg (ref 26.0–34.0)
MCHC: 33.9 g/dL (ref 30.0–36.0)
MCV: 77.7 fL — ABNORMAL LOW (ref 80.0–100.0)
Platelets: 243 K/uL (ref 150–400)
RBC: 5.16 MIL/uL (ref 4.22–5.81)
RDW: 14.3 % (ref 11.5–15.5)
WBC: 9.6 K/uL (ref 4.0–10.5)
nRBC: 0 % (ref 0.0–0.2)

## 2024-06-06 LAB — LIPASE, BLOOD: Lipase: 21 U/L (ref 11–51)

## 2024-06-06 LAB — RESP PANEL BY RT-PCR (RSV, FLU A&B, COVID)  RVPGX2
Influenza A by PCR: NEGATIVE
Influenza B by PCR: NEGATIVE
Resp Syncytial Virus by PCR: NEGATIVE
SARS Coronavirus 2 by RT PCR: NEGATIVE

## 2024-06-06 MED ORDER — ONDANSETRON 4 MG PO TBDP: 4.0000 mg | ORAL_TABLET | Freq: Four times a day (QID) | ORAL | 0 refills | Status: AC | PRN

## 2024-06-06 MED ORDER — MORPHINE SULFATE (PF) 4 MG/ML IV SOLN
4.0000 mg | Freq: Once | INTRAVENOUS | Status: AC
  Administered 2024-06-06: 4 mg via INTRAVENOUS
  Filled 2024-06-06: qty 1

## 2024-06-06 MED ORDER — IOHEXOL 300 MG/ML  SOLN
100.0000 mL | Freq: Once | INTRAMUSCULAR | Status: AC | PRN
  Administered 2024-06-06: 100 mL via INTRAVENOUS

## 2024-06-06 MED ORDER — DICYCLOMINE HCL 20 MG PO TABS: 20.0000 mg | ORAL_TABLET | Freq: Two times a day (BID) | ORAL | 0 refills | Status: AC

## 2024-06-06 NOTE — Discharge Instructions (Addendum)
 In the ER today for abdominal cramping and nausea.  Prescribing Zofran  for nausea and Bentyl  for the cramping.  Your CT scan did not show an urgent cause for your pain and they did note a new 15 mm lesion on your kidney that was indeterminate.  The radiologist recommended prompt outpatient MRI or CT with and without contrast for further evaluation.  You could either have your PCP or [facility] order this.  I also gave you local urology follow-up.  I have also given you information for local GI follow-up.  Come back to the ER for new or worsening symptoms.

## 2024-06-06 NOTE — ED Provider Notes (Signed)
 " [place] EMERGENCY DEPARTMENT AT [facility] Provider Note   CSN: [id number] Arrival date & time: 06/06/24  1557     Patient presents with: Abdominal Pain   Perry [name] is a 68 y.o. male.  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  Presents ER today complaint of nausea and cramping in his abdomen since last night.  Denies diarrhea or vomiting, denies fever or chills.  Denies urinary symptoms.  He states symptoms been gradually worsening, he denies any exacerbating or alleviating factors    Abdominal Pain      Prior to Admission medications  Medication Sig Start Date End Date Taking? Authorizing Provider  acetaminophen  (TYLENOL ) 325 MG tablet Take 2 tablets (650 mg total) by mouth every 6 (six) hours as needed for mild pain (or Fever >/= 101). 02/04/23   Emokpae, Courage, MD  albuterol  (PROVENTIL ) (2.5 MG/3ML) 0.083% nebulizer solution Take 3 mLs (2.5 mg total) by nebulization every 2 (two) hours as needed for wheezing or shortness of breath. 02/04/23   Pearlean, Courage, MD  amLODipine  (NORVASC ) 10 MG tablet Take 1 tablet (10 mg total) by mouth daily. 02/04/23   Pearlean Manus, MD  baclofen  (LIORESAL ) 10 MG tablet Take 20 mg by mouth 2 (two) times daily. 05/29/22   [provider]  Calcium  Citrate-Vitamin D 315-5 MG-MCG TABS Take 1 tablet by mouth in the morning and at bedtime.    [provider]  Cholecalciferol 25 MCG (1000 UT) tablet Take 1,000 Units by mouth daily. 07/06/21   [provider]  cyanocobalamin 1000 MCG tablet Take 1,000 mcg by mouth daily. 08/12/17   [provider]  ferrous sulfate  325 (65 FE) MG tablet Take 1 tablet (325 mg total) by mouth daily with breakfast. 02/05/23   Pearlean Manus, MD  furosemide  (LASIX ) 20 MG tablet Take 1 tablet (20 mg total) by mouth daily. 02/04/23 02/04/24  Pearlean Manus, MD   gabapentin  (NEURONTIN ) 300 MG capsule Take 1 capsule (300 mg total) by mouth 4 (four) times daily. 02/04/23   Pearlean Manus, MD  Meclizine HCl 25 MG CHEW Chew 1 tablet by mouth every 6 (six) hours as needed (vertigo). 07/03/22   [provider]  Nebulizers (COMPRESSOR/NEBULIZER) MISC 1 Units by Does not apply route daily as needed. 02/04/23   Pearlean Manus, MD  niacin (VITAMIN B3) 500 MG tablet Take 500 mg by mouth See admin instructions. Take 500 mg by mouth daily with breakfast and 1000 mg at bedtime. 02/15/12   [provider]  omeprazole  (PRILOSEC) 20 MG capsule Take 1 capsule (20 mg total) by mouth daily. 02/04/23   Pearlean Manus, MD  oxybutynin  (DITROPAN -XL) 10 MG 24 hr tablet Take 1 tablet (10 mg total) by mouth daily. 02/04/23   Pearlean Manus, MD  pravastatin  (PRAVACHOL ) 10 MG tablet Take 1 tablet (10 mg total) by mouth daily. 02/04/23   Pearlean Manus, MD  promethazine  (PHENERGAN ) 25 MG tablet Take 1 tablet (25 mg total) by mouth every 8 (eight) hours as needed for nausea or vomiting. 02/04/23   Pearlean Manus, MD    Allergies: Aspirin    Review of Systems  Gastrointestinal:  Positive for abdominal pain.    Updated Vital Signs BP (!) 143/93   Pulse 72   Temp 99.7 F (37.6 C) (Oral)   Resp 18   Ht 5' 9 (1.753 m)   Wt 89.8 kg   SpO2 96%  BMI 29.24 kg/m   Physical Exam Vitals and nursing note reviewed.  Constitutional:      General: He is not in acute distress.    Appearance: He is well-developed.  HENT:     Head: Normocephalic and atraumatic.  Eyes:     Conjunctiva/sclera: Conjunctivae normal.  Cardiovascular:     Rate and Rhythm: Normal rate and regular rhythm.     Heart sounds: No murmur heard. Pulmonary:     Effort: Pulmonary effort is normal. No respiratory distress.     Breath sounds: Normal breath sounds.  Abdominal:     Palpations: Abdomen is soft.     Tenderness: There is generalized abdominal tenderness. There is no right CVA  tenderness, left CVA tenderness or rebound.  Musculoskeletal:        General: No swelling.     Cervical back: Neck supple.  Skin:    General: Skin is warm and dry.     Capillary Refill: Capillary refill takes less than 2 seconds.  Neurological:     General: No focal deficit present.     Mental Status: He is alert and oriented to person, place, and time.  Psychiatric:        Mood and Affect: Mood normal.     (all labs ordered are listed, but only abnormal results are displayed) Labs Reviewed  CBC - Abnormal; Notable for the following components:      Result Value   MCV 77.7 (*)    All other components within normal limits  LIPASE, BLOOD  COMPREHENSIVE METABOLIC PANEL WITH GFR  URINALYSIS, ROUTINE W REFLEX MICROSCOPIC    EKG: None  Radiology: No results found.   Procedures   Medications Ordered in the ED - No data to display                                  Medical Decision Making This patient presents to the ED for concern of diffuse abdominal pain, this involves an extensive number of treatment options, and is a complaint that carries with it a high risk of complications and morbidity.  The differential diagnosis includes bowel obstruction, constipation, colitis, diverticulitis, appendicitis, cholecystitis, UTI, ureterolithiasis, IBD, other   Co morbidities that complicate the patient evaluation  Right nephrectomy, prostate cancer, CIDP   Additional history obtained:  Additional history obtained from EMR External records from outside source obtained and reviewed including prior notes and labs   Lab Tests:  I Ordered, and personally interpreted labs.  The pertinent results include: Lipase normal, CBC normal, UA's shows no sign of infection, COVID flu RSV negative, CMP with slightly decreased CO2 at 19, calcium  slightly elevated at 10.7 otherwise normal,   Imaging Studies ordered:  I ordered imaging studies including CT abdomen pelvis I independently  visualized and interpreted imaging which showed new left kidney lesion I agree with the radiologist interpretation     Problem List / ED Course / Critical interventions / Medication management  Diffuse abdominal pain described as cramping with nausea since last night, no vomiting or diarrhea.  Patient has some diffuse tenderness, no rebound guarding or rigidity.  No fevers or chills, he does not have a leukocytosis or anemia.  Urinalysis was normal, COVID flu RSV was negative, CMP shows a decreased CO2, normal anion gap, calcium  slightly elevated 10.7.  CT was ordered due to diffuse pain especially in setting of history of prostate cancer and prior nephrectomy.  Patient does not have acute findings relating to today's pain and there is no obstruction.  Patient was noted on CT to have a new 15 mm heterogeneous mildly hypodense rounded area in the anterior pole of left kidney, radiology recommended prompt renal protocol MRI or CT scan with and without contrast.  I discussed with the patient, he is aware that this needs to be followed up closely given that he has a solitary kidney.  He is established with urology at the [facility].  There is no emergent cause for his abdominal pain today, given Bentyl  and Zofran  for home and advised on GI follow-up as well.  He was given strict return precautions.  I ordered medication including morphine  for pain Reevaluation of the patient after these medicines showed that the patient improved I have reviewed the patients home medicines and have made adjustments as needed    Test / Admission - Considered:  Patient does not meet criteria for admission, will discharge home    Amount and/or Complexity of Data Reviewed Labs: ordered. Radiology: ordered.  Risk Prescription drug management.        Final diagnoses:  None    ED Discharge Orders     None          Suellen Sherran LABOR, PA-C 06/06/24 2352  "

## 2024-06-06 NOTE — ED Notes (Signed)
 PA at bedside.

## 2024-06-06 NOTE — ED Triage Notes (Signed)
 Pov c/o of being sick on their stomach since last night that is progressively gotten worse. Endorses nausea. Denies vomiting, diarrhea, fever. Denies any urinary problems. Pt in a motorized wheelchair

## 2024-06-06 NOTE — ED Notes (Signed)
 Patient transported to CT

## 2024-07-13 ENCOUNTER — Ambulatory Visit (HOSPITAL_COMMUNITY): Admitting: Speech Pathology

## 2024-07-20 ENCOUNTER — Ambulatory Visit (HOSPITAL_COMMUNITY): Admitting: Speech Pathology

## 2024-07-30 ENCOUNTER — Ambulatory Visit (HOSPITAL_COMMUNITY)
